# Patient Record
Sex: Female | Born: 1955
Health system: Southern US, Community
[De-identification: ages and names within clinical notes are randomized; demographics above are authoritative.]

## PROBLEM LIST (undated history)

## (undated) DIAGNOSIS — M1990 Unspecified osteoarthritis, unspecified site: Secondary | ICD-10-CM

## (undated) DIAGNOSIS — K219 Gastro-esophageal reflux disease without esophagitis: Secondary | ICD-10-CM

## (undated) DIAGNOSIS — I10 Essential (primary) hypertension: Secondary | ICD-10-CM

## (undated) DIAGNOSIS — R51 Headache: Secondary | ICD-10-CM

## (undated) DIAGNOSIS — T7840XA Allergy, unspecified, initial encounter: Secondary | ICD-10-CM

## (undated) HISTORY — DX: Allergy, unspecified, initial encounter: T78.40XA

---

## 1986-04-29 HISTORY — PX: LASER ABLATION OF THE CERVIX: SHX1949

## 2001-02-11 ENCOUNTER — Encounter: Payer: Self-pay | Admitting: Family Medicine

## 2001-02-11 ENCOUNTER — Ambulatory Visit (HOSPITAL_COMMUNITY): Admission: RE | Admit: 2001-02-11 | Discharge: 2001-02-11 | Payer: Self-pay | Admitting: Family Medicine

## 2001-02-12 ENCOUNTER — Ambulatory Visit (HOSPITAL_COMMUNITY): Admission: RE | Admit: 2001-02-12 | Discharge: 2001-02-12 | Payer: Self-pay | Admitting: Family Medicine

## 2001-02-12 ENCOUNTER — Encounter: Payer: Self-pay | Admitting: Family Medicine

## 2001-09-02 ENCOUNTER — Encounter: Payer: Self-pay | Admitting: Family Medicine

## 2001-09-02 ENCOUNTER — Ambulatory Visit (HOSPITAL_COMMUNITY): Admission: RE | Admit: 2001-09-02 | Discharge: 2001-09-02 | Payer: Self-pay | Admitting: Family Medicine

## 2002-03-10 ENCOUNTER — Ambulatory Visit (HOSPITAL_COMMUNITY): Admission: RE | Admit: 2002-03-10 | Discharge: 2002-03-10 | Payer: Self-pay | Admitting: Family Medicine

## 2002-03-10 ENCOUNTER — Encounter: Payer: Self-pay | Admitting: Family Medicine

## 2002-09-29 ENCOUNTER — Ambulatory Visit (HOSPITAL_COMMUNITY): Admission: RE | Admit: 2002-09-29 | Discharge: 2002-09-29 | Payer: Self-pay | Admitting: Internal Medicine

## 2002-09-29 ENCOUNTER — Encounter: Payer: Self-pay | Admitting: Internal Medicine

## 2004-06-07 ENCOUNTER — Ambulatory Visit (HOSPITAL_COMMUNITY): Admission: RE | Admit: 2004-06-07 | Discharge: 2004-06-07 | Payer: Self-pay | Admitting: Family Medicine

## 2004-06-15 ENCOUNTER — Encounter: Admission: RE | Admit: 2004-06-15 | Discharge: 2004-06-15 | Payer: Self-pay | Admitting: Family Medicine

## 2006-04-01 ENCOUNTER — Encounter: Admission: RE | Admit: 2006-04-01 | Discharge: 2006-04-01 | Payer: Self-pay | Admitting: Family Medicine

## 2006-04-09 ENCOUNTER — Encounter: Admission: RE | Admit: 2006-04-09 | Discharge: 2006-04-09 | Payer: Self-pay | Admitting: Family Medicine

## 2006-10-07 ENCOUNTER — Encounter: Admission: RE | Admit: 2006-10-07 | Discharge: 2006-10-07 | Payer: Self-pay | Admitting: Family Medicine

## 2006-10-27 ENCOUNTER — Ambulatory Visit: Payer: Self-pay | Admitting: Orthopedic Surgery

## 2006-10-28 ENCOUNTER — Encounter (HOSPITAL_COMMUNITY): Admission: RE | Admit: 2006-10-28 | Discharge: 2006-11-27 | Payer: Self-pay | Admitting: Orthopedic Surgery

## 2006-10-28 ENCOUNTER — Ambulatory Visit (HOSPITAL_COMMUNITY): Payer: Self-pay | Admitting: Orthopedic Surgery

## 2006-11-01 ENCOUNTER — Ambulatory Visit (HOSPITAL_COMMUNITY): Payer: Self-pay | Admitting: Internal Medicine

## 2006-11-03 ENCOUNTER — Ambulatory Visit: Payer: Self-pay | Admitting: Orthopedic Surgery

## 2006-11-10 ENCOUNTER — Ambulatory Visit: Payer: Self-pay | Admitting: Orthopedic Surgery

## 2008-02-11 ENCOUNTER — Encounter: Admission: RE | Admit: 2008-02-11 | Discharge: 2008-02-11 | Payer: Self-pay | Admitting: Family Medicine

## 2008-07-15 ENCOUNTER — Encounter: Admission: RE | Admit: 2008-07-15 | Discharge: 2008-07-15 | Payer: Self-pay | Admitting: Family Medicine

## 2009-07-17 ENCOUNTER — Encounter: Admission: RE | Admit: 2009-07-17 | Discharge: 2009-07-17 | Payer: Self-pay | Admitting: Family Medicine

## 2010-04-29 HISTORY — PX: UTERINE FIBROID SURGERY: SHX826

## 2010-05-20 ENCOUNTER — Encounter: Payer: Self-pay | Admitting: Family Medicine

## 2010-08-21 ENCOUNTER — Other Ambulatory Visit: Payer: Self-pay | Admitting: Family Medicine

## 2010-08-21 DIAGNOSIS — Z1231 Encounter for screening mammogram for malignant neoplasm of breast: Secondary | ICD-10-CM

## 2010-08-29 ENCOUNTER — Ambulatory Visit
Admission: RE | Admit: 2010-08-29 | Discharge: 2010-08-29 | Disposition: A | Discharge status: Home / Self Care | Payer: PRIVATE HEALTH INSURANCE | Source: Ambulatory Visit | Attending: Family Medicine | Admitting: Family Medicine

## 2010-08-29 DIAGNOSIS — Z1231 Encounter for screening mammogram for malignant neoplasm of breast: Secondary | ICD-10-CM

## 2010-11-02 ENCOUNTER — Encounter (HOSPITAL_COMMUNITY): Payer: Self-pay

## 2010-11-02 ENCOUNTER — Other Ambulatory Visit (HOSPITAL_COMMUNITY): Payer: PRIVATE HEALTH INSURANCE

## 2010-11-02 ENCOUNTER — Encounter (HOSPITAL_COMMUNITY)
Admission: RE | Admit: 2010-11-02 | Discharge: 2010-11-02 | Disposition: A | Discharge status: Home / Self Care | Payer: No Typology Code available for payment source | Source: Ambulatory Visit | Attending: Obstetrics and Gynecology | Admitting: Obstetrics and Gynecology

## 2010-11-02 HISTORY — DX: Essential (primary) hypertension: I10

## 2010-11-02 HISTORY — DX: Unspecified osteoarthritis, unspecified site: M19.90

## 2010-11-02 HISTORY — DX: Gastro-esophageal reflux disease without esophagitis: K21.9

## 2010-11-02 HISTORY — DX: Headache: R51

## 2010-11-02 LAB — BASIC METABOLIC PANEL
BUN: 14 mg/dL (ref 6–23)
Creatinine, Ser: 0.65 mg/dL (ref 0.50–1.10)
GFR calc non Af Amer: >60 mL/min (ref >60)
Potassium: 3.8 mEq/L (ref 3.5–5.1)
Sodium: 135 mEq/L (ref 135–145)

## 2010-11-02 LAB — CBC
HCT: 33.4 % — ABNORMAL LOW (ref 36.0–46.0)
MCH: 25.5 pg — ABNORMAL LOW (ref 26.0–34.0)
MCHC: 32 g/dL (ref 30.0–36.0)
RBC: 4.19 MIL/uL (ref 3.87–5.11)

## 2010-11-02 MED ORDER — KETOROLAC TROMETHAMINE 15 MG/ML IJ SOLN: 15.0000 mg | Freq: Once | INTRAMUSCULAR | Status: DC

## 2010-11-02 MED ORDER — MEPERIDINE HCL 25 MG/ML IJ SOLN: 6.2500 mg | INTRAMUSCULAR | Status: DC | PRN

## 2010-11-02 MED ORDER — HYDROMORPHONE HCL 1 MG/ML IJ SOLN: 0.2500 mg | INTRAMUSCULAR | Status: DC | PRN

## 2010-11-02 MED ORDER — ACETAMINOPHEN 10 MG/ML IV SOLN: 1000.0000 mg | Freq: Once | INTRAVENOUS | Status: DC | PRN

## 2010-11-02 MED ORDER — FENTANYL CITRATE 0.05 MG/ML IJ SOLN: 1.0000 ug/kg | INTRAMUSCULAR | Status: DC | PRN

## 2010-11-02 MED ORDER — PROMETHAZINE HCL 25 MG/ML IJ SOLN: 6.2500 mg | INTRAMUSCULAR | Status: DC | PRN

## 2010-11-02 NOTE — Patient Instructions (Addendum)
Given written instructions20 Carrie Woodward  11/02/2010   Your procedure is scheduled on:  Tuesday  Report to [facility] at 9 AM.  Call this number if you have problems the morning of surgery: [phone]   Remember:   Do not eat food:After Midnight.  Do not drink clear liquids: After Midnight.  Take these medicines the morning of surgery with A SIP OF WATER: see list    Do not wear jewelry, make-up or nail polish.  Do not bring valuables to the hospital.  Contacts, dentures or bridgework may not be worn into surgery.  Leave suitcase in the car. After surgery it may be brought to your room.  For patients admitted to the hospital, checkout time is 11:00 AM the day of discharge.   Patients discharged the day of surgery will not be allowed to drive home.  Name and phone number of your driver: Jacqlyn Krauss - boyfriend  Special Instructions:    Please read over the following fact sheets that you were given

## 2010-11-02 NOTE — Anesthesia Preprocedure Evaluation (Addendum)
Anesthesia Evaluation  Name, MR# and DOB Patient awake  General Assessment Comment  Reviewed: Allergy & Precautions, H&P  and Patient's Chart, lab work & pertinent test results  Airway Mallampati: III TM Distance: >3 FB Neck ROM: Full    Dental No notable dental hx (+) Teeth Intact   Pulmonaryneg pulmonary ROS    clear to auscultation  pulmonary exam normal   Cardiovascular hypertension, Pt. on medications Regular Normal   Neuro/PsychNegative Neurological ROS Negative Psych ROS  GI/Hepatic/Renal negative GI ROS, negative Renal ROS (+)  GERD Controlled     Endo/Other  Negative Endocrine ROS (+) Diabetes mellitus-, Well Controlled, Type 2, Oral Hypoglycemic Agents  Abdominal Normal abdominal exam  (+)  Abdomen: soft.    Musculoskeletal negative musculoskeletal ROS (+) Fibromyalgia - Hematology negative hematology ROS (+)   Peds  Reproductive/Obstetrics negative OB ROS   Anesthesia Other Findings             Anesthesia Physical Anesthesia Plan  ASA: III  Anesthesia Plan: General   Post-op Pain Management:    Induction: Intravenous  Airway Management Planned: Oral ETT  Additional Equipment:   Intra-op Plan:   Post-operative Plan: Extubation in OR  Informed Consent: I have reviewed the patients History and Physical, chart, labs and discussed the procedure including the risks, benefits and alternatives for the proposed anesthesia with the patient or authorized representative who has indicated his/her understanding and acceptance.   Dental advisory given  Plan Discussed with: CRNA and Anesthesiologist (AP)  Anesthesia Plan Comments:         Anesthesia Quick Evaluation

## 2010-11-12 MED ORDER — OXYMETAZOLINE HCL 0.05 % NA SOLN
2.0000 | Freq: Once | NASAL | Status: DC
  Filled 2010-11-12: qty 15

## 2010-11-13 ENCOUNTER — Encounter (HOSPITAL_COMMUNITY): Payer: Self-pay | Admitting: Certified Registered Nurse Anesthetist

## 2010-11-13 ENCOUNTER — Ambulatory Visit (HOSPITAL_COMMUNITY)
Admission: RE | Admit: 2010-11-13 | Discharge: 2010-11-13 | Disposition: A | Discharge status: Home / Self Care | Payer: No Typology Code available for payment source | Source: Ambulatory Visit | Attending: Obstetrics and Gynecology | Admitting: Obstetrics and Gynecology

## 2010-11-13 ENCOUNTER — Encounter (HOSPITAL_COMMUNITY): Payer: Self-pay | Admitting: Anesthesiology

## 2010-11-13 ENCOUNTER — Ambulatory Visit (HOSPITAL_COMMUNITY): Payer: No Typology Code available for payment source | Admitting: Certified Registered Nurse Anesthetist

## 2010-11-13 ENCOUNTER — Other Ambulatory Visit: Payer: Self-pay | Admitting: Obstetrics and Gynecology

## 2010-11-13 ENCOUNTER — Encounter (HOSPITAL_COMMUNITY)
Admission: RE | Disposition: A | Discharge status: Home / Self Care | Payer: Self-pay | Source: Ambulatory Visit | Attending: Obstetrics and Gynecology

## 2010-11-13 DIAGNOSIS — N920 Excessive and frequent menstruation with regular cycle: Secondary | ICD-10-CM | POA: Insufficient documentation

## 2010-11-13 DIAGNOSIS — Z01818 Encounter for other preprocedural examination: Secondary | ICD-10-CM | POA: Insufficient documentation

## 2010-11-13 DIAGNOSIS — Z01812 Encounter for preprocedural laboratory examination: Secondary | ICD-10-CM | POA: Insufficient documentation

## 2010-11-13 DIAGNOSIS — D250 Submucous leiomyoma of uterus: Secondary | ICD-10-CM | POA: Insufficient documentation

## 2010-11-13 DIAGNOSIS — N840 Polyp of corpus uteri: Secondary | ICD-10-CM | POA: Insufficient documentation

## 2010-11-13 LAB — GLUCOSE, CAPILLARY: Glucose-Capillary: 91 mg/dL (ref 70–99)

## 2010-11-13 SURGERY — DILATATION & CURETTAGE/HYSTEROSCOPY WITH VERSAPOINT RESECTION
Anesthesia: Choice

## 2010-11-13 MED ORDER — KETOROLAC TROMETHAMINE 30 MG/ML IJ SOLN
INTRAMUSCULAR | Status: DC | PRN
  Administered 2010-11-13: 30 mg via INTRAVENOUS

## 2010-11-13 MED ORDER — KETOROLAC TROMETHAMINE 60 MG/2ML IM SOLN
INTRAMUSCULAR | Status: DC | PRN
  Administered 2010-11-13: 30 mg via INTRAMUSCULAR

## 2010-11-13 MED ORDER — ONDANSETRON HCL 4 MG/2ML IJ SOLN
INTRAMUSCULAR | Status: DC | PRN
  Administered 2010-11-13: 4 mg via INTRAVENOUS

## 2010-11-13 MED ORDER — FENTANYL CITRATE 0.05 MG/ML IJ SOLN: 25.0000 ug | INTRAMUSCULAR | Status: DC | PRN

## 2010-11-13 MED ORDER — ONDANSETRON HCL 4 MG/2ML IJ SOLN
INTRAMUSCULAR | Status: AC
  Filled 2010-11-13: qty 2

## 2010-11-13 MED ORDER — CHLOROPROCAINE HCL 1 % IJ SOLN
INTRAMUSCULAR | Status: DC | PRN
  Administered 2010-11-13: 20 mL

## 2010-11-13 MED ORDER — DEXAMETHASONE SODIUM PHOSPHATE 10 MG/ML IJ SOLN
INTRAMUSCULAR | Status: AC
  Filled 2010-11-13: qty 1

## 2010-11-13 MED ORDER — FENTANYL CITRATE 0.05 MG/ML IJ SOLN
INTRAMUSCULAR | Status: AC
  Filled 2010-11-13: qty 2

## 2010-11-13 MED ORDER — LIDOCAINE HCL (CARDIAC) 20 MG/ML IV SOLN
INTRAVENOUS | Status: AC
  Filled 2010-11-13: qty 5

## 2010-11-13 MED ORDER — KETOROLAC TROMETHAMINE 30 MG/ML IJ SOLN
INTRAMUSCULAR | Status: AC
  Filled 2010-11-13: qty 1

## 2010-11-13 MED ORDER — MIDAZOLAM HCL 2 MG/2ML IJ SOLN
INTRAMUSCULAR | Status: AC
  Filled 2010-11-13: qty 2

## 2010-11-13 MED ORDER — PROPOFOL 10 MG/ML IV EMUL
INTRAVENOUS | Status: AC
  Filled 2010-11-13: qty 20

## 2010-11-13 MED ORDER — FENTANYL CITRATE 0.05 MG/ML IJ SOLN
INTRAMUSCULAR | Status: DC | PRN
  Administered 2010-11-13: 100 ug via INTRAVENOUS

## 2010-11-13 MED ORDER — LACTATED RINGERS IV SOLN
INTRAVENOUS | Status: DC
  Administered 2010-11-13 (×2): via INTRAVENOUS

## 2010-11-13 MED ORDER — DEXAMETHASONE SODIUM PHOSPHATE 10 MG/ML IJ SOLN
INTRAMUSCULAR | Status: DC | PRN
  Administered 2010-11-13: 10 mg via INTRAVENOUS

## 2010-11-13 MED ORDER — MEPERIDINE HCL 25 MG/ML IJ SOLN: 6.2500 mg | INTRAMUSCULAR | Status: DC | PRN

## 2010-11-13 MED ORDER — LIDOCAINE HCL (CARDIAC) 20 MG/ML IV SOLN
INTRAVENOUS | Status: DC | PRN
  Administered 2010-11-13: 50 mg via INTRAVENOUS

## 2010-11-13 MED ORDER — PROPOFOL 10 MG/ML IV EMUL
INTRAVENOUS | Status: DC | PRN
  Administered 2010-11-13: 150 mg via INTRAVENOUS

## 2010-11-13 MED ORDER — MIDAZOLAM HCL 5 MG/5ML IJ SOLN
INTRAMUSCULAR | Status: DC | PRN
  Administered 2010-11-13: 2 mg via INTRAVENOUS

## 2010-11-13 MED ORDER — SODIUM CHLORIDE 0.9 % IR SOLN
Status: DC | PRN
  Administered 2010-11-13: 3000 mL

## 2010-11-13 SURGICAL SUPPLY — 19 items
CANISTER SUCTION 2500CC (MISCELLANEOUS) ×2 IMPLANT
CATH ROBINSON RED A/P 16FR (CATHETERS) ×2 IMPLANT
CLOTH BEACON ORANGE TIMEOUT ST (SAFETY) ×2 IMPLANT
CONTAINER PREFILL 10% NBF 60ML (FORM) ×4 IMPLANT
DRAPE UTILITY XL STRL (DRAPES) ×4 IMPLANT
ELECT REM PT RETURN 9FT ADLT (ELECTROSURGICAL) ×2
ELECTRODE REM PT RTRN 9FT ADLT (ELECTROSURGICAL) ×1 IMPLANT
ELECTRODE ROLLER BARREL 22FR (ELECTROSURGICAL) IMPLANT
ELECTRODE ROLLER VERSAPOINT (ELECTRODE) IMPLANT
ELECTRODE RT ANGLE VERSAPOINT (CUTTING LOOP) ×1 IMPLANT
ELECTRODE VAPORCUT 22FR (ELECTROSURGICAL) IMPLANT
GLOVE BIO SURGEON STRL SZ 6.5 (GLOVE) ×2 IMPLANT
GLOVE BIOGEL PI IND STRL 7.0 (GLOVE) ×2 IMPLANT
GLOVE BIOGEL PI INDICATOR 7.0 (GLOVE) ×2
GOWN BRE IMP SLV AUR LG STRL (GOWN DISPOSABLE) ×4 IMPLANT
LOOP ANGLED CUTTING 22FR (CUTTING LOOP) IMPLANT
PACK HYSTEROSCOPY LF (CUSTOM PROCEDURE TRAY) ×2 IMPLANT
TOWEL OR 17X24 6PK STRL BLUE (TOWEL DISPOSABLE) ×4 IMPLANT
WATER STERILE IRR 1000ML POUR (IV SOLUTION) ×2 IMPLANT

## 2010-11-13 NOTE — Transfer of Care (Signed)
Immediate Anesthesia Transfer of Care Note  Patient: Carrie Woodward  Procedure(s) Performed:  DILATATION & CURETTAGE/HYSTEROSCOPY WITH VERSAPOINT RESECTION  Patient Location: PACU  Anesthesia Type: General  Level of Consciousness: awake, alert  and oriented  Airway & Oxygen Therapy: Patient Spontanous Breathing and Patient connected to nasal cannula oxygen  Post-op Assessment: Report given to PACU RN and Post -op Vital signs reviewed and stable  Post vital signs: stable  Complications: No apparent anesthesia complications

## 2010-11-13 NOTE — Anesthesia Postprocedure Evaluation (Signed)
  Anesthesia Post-op Note  Patient: Carrie Woodward  Procedure(s) Performed:  DILATATION & CURETTAGE/HYSTEROSCOPY WITH VERSAPOINT RESECTION  Patient Location: PACU  Anesthesia Type: General  Level of Consciousness: awake, alert  and oriented  Airway and Oxygen Therapy: Patient Spontanous Breathing  Post-op Pain: none  Post-op Assessment: Post-op Vital signs reviewed, Patient's Cardiovascular Status Stable, Respiratory Function Stable, Patent Airway, No signs of Nausea or vomiting and Pain level controlled  Post-op Vital Signs: Reviewed and stable  Complications: No apparent anesthesia complications

## 2010-11-13 NOTE — Preoperative (Addendum)
Beta Blockers   Reason not to administer Beta Blockers:Not ApplicableTaken This AM

## 2010-11-13 NOTE — Brief Op Note (Signed)
11/13/2010  1:13 PM  PATIENT:  Carrie Woodward  55 y.o. female  PRE-OPERATIVE DIAGNOSIS:  menorrhagia;submucosal fibroid;endometrail thickening  POST-OPERATIVE DIAGNOSIS:  same  PROCEDURE:  Procedure(s): DILATATION & CURETTAGE/HYSTEROSCOPY WITH VERSAPOINT RESECTION OF SUBMUCOSAL FIBROID, ENDOMETRIAL POLYP  SURGEON:  Surgeon(s): Zandra Lajeunesse Cathie Beams, MD  PHYSICIAN ASSISTANT: none  ASSISTANTS: NONE  ANESTHESIA:   general and paracervical block  ESTIMATED BLOOD LOSS: MIN  BLOOD ADMINISTERED:none  DRAINS: none   LOCAL MEDICATIONS USED:  NONE  SPECIMEN:  EMC, RESECTION FIBROID, ENDOMETRIAL POLYP  DISPOSITION OF SPECIMEN:  PATHOLOGY  COUNTS:  YES  TOURNIQUET:  * No tourniquets in log *  DICTATION #: [id number]  PLAN OF CARE:HOME  PATIENT DISPOSITION:  PACU - hemodynamically stable.   Delay start of Pharmacological VTE agent (>24hrs) due to surgical blood loss or risk of bleeding:  no              11/13/2010  1:14 PM  PATIENT:  Carrie Woodward  55 y.o. female  PRE-OPERATIVE DIAGNOSIS:  menorrhagia;submucosal fibroid;endometrail thickening  POST-OPERATIVE DIAGNOSIS:  same  PROCEDURE:  Procedure(s): DILATATION & CURETTAGE/HYSTEROSCOPY WITH VERSAPOINT RESECTION  SURGEON:  Surgeon(s): Robertlee Rogacki A Tammra Pressman, MD  ASSISTANTS: none   ANESTHESIA:   general and paracervical block  PROCEDURE:  FINDINGS:  DESCRIPTION OF OPERATION:  ESTIMATED BLOOD LOSS: * No blood loss amount entered *   Intake/Output Summary (Last 24 hours) at 11/13/10 1314 Last data filed at 11/13/10 1244  Gross per 24 hour  Intake   1400 ml  Output    150 ml  Net   1250 ml     BLOOD ADMINISTERED:none   LOCAL MEDICATIONS USED:  NONE  SPECIMEN:  Source of Specimen: UTERUS  DISPOSITION OF SPECIMEN:  PATHOLOGY  COUNTS:  YES  PLAN OF CARE: Transfer to PACU

## 2010-11-14 NOTE — Op Note (Signed)
Carrie Woodward, Carrie Woodward            ACCOUNT NO.:  [id number]  MEDICAL RECORD NO.:  [id number]  LOCATION:  [facility]                          FACILITY:  [facility]  PHYSICIAN:  Maxie Better, M.D.DATE OF BIRTH:  1955/06/05  DATE OF PROCEDURE:  11/13/2010 DATE OF DISCHARGE:  11/13/2010                              OPERATIVE REPORT   PREOPERATIVE DIAGNOSES:  Menorrhagia and submucosal fibroid.  POSTOPERATIVE DIAGNOSES:  Submucosal fibroids x2 and endometrial polyps.  PROCEDURES:  Diagnostic hysteroscopy, hysteroscopic resection of submucosal fibroids, endometrial polyps via VersaPoint, dilation and curettage.  ANESTHESIA:  General, paracervical block.  SURGEON:  Maxie Better, MD  ASSISTANT:  None.  PROCEDURE IN DETAIL:  Under adequate general anesthesia, the patient was placed in the dorsolithotomy position.  She was sterilely prepped and draped in usual fashion.  Indwelling Foley catheter was not placed. Examination under anesthesia revealed a fibroid retroverted uterus.  No adnexal masses could be appreciated.  A bivalve speculum was placed in the vagina.  Single-tooth tenaculum was placed in the anterior lip of cervix.  A 20 mL of 1% lidocaine was injected paracervically.  The cervix was then serially dilated up to #31 [facility] dilator.  A resectoscope with the VersaPoint was inserted and anterior and posterior submucosal fibroid was noted, both of which were resected.  At that point, two polypoid lesions were also noted, both the polyps were also resected.  The left tubal ostia could be seen.  The right was not seen. Severalo pieces of the resected fibroids were removed.  The resectoscope was removed.  The cavity was then curetted.  Resectoscope was inserted. No other lesions were noted.  The procedure was then terminated by removing all instruments from the vagina.  SPECIMENS:  Endometrial curetting, endometrial polyp, and fibroid resected, all sent to pathology.  ESTIMATED  BLOOD LOSS:  Minimal.  FLUID DEFICIT:  90 mL.  COMPLICATIONS:  None.  COUNTS:  Sponge, instrument counts x2 was correct.  FOLLOWUP:  The patient tolerated the procedure well and was transferred to recovery room in stable condition.     Maxie Better, M.D.     [place]/MEDQ  D:  11/13/2010  T:  11/14/2010  Job:  [id number]

## 2011-02-12 LAB — CBC
Hemoglobin: 11.6 — ABNORMAL LOW
RBC: 4.24

## 2011-02-12 LAB — DIFFERENTIAL
Lymphocytes Relative: 26
Lymphs Abs: 1.4
Monocytes Relative: 7
Neutro Abs: 3.5
Neutrophils Relative %: 65

## 2011-03-16 ENCOUNTER — Emergency Department (HOSPITAL_COMMUNITY)
Admission: EM | Admit: 2011-03-16 | Discharge: 2011-03-16 | Disposition: A | Discharge status: Home / Self Care | Payer: No Typology Code available for payment source | Attending: Emergency Medicine | Admitting: Emergency Medicine

## 2011-03-16 ENCOUNTER — Encounter (HOSPITAL_COMMUNITY): Payer: Self-pay | Admitting: Emergency Medicine

## 2011-03-16 DIAGNOSIS — K089 Disorder of teeth and supporting structures, unspecified: Secondary | ICD-10-CM | POA: Insufficient documentation

## 2011-03-16 DIAGNOSIS — K0889 Other specified disorders of teeth and supporting structures: Secondary | ICD-10-CM

## 2011-03-16 MED ORDER — HYDROCODONE-ACETAMINOPHEN 5-500 MG PO TABS: 1.0000 | ORAL_TABLET | Freq: Four times a day (QID) | ORAL | Status: AC | PRN

## 2011-03-16 MED ORDER — PENICILLIN V POTASSIUM 500 MG PO TABS: 500.0000 mg | ORAL_TABLET | Freq: Four times a day (QID) | ORAL | Status: AC

## 2011-03-16 NOTE — ED Provider Notes (Signed)
History     CSN: [id number] Arrival date & time: 03/16/2011 11:46 AM   First MD Initiated Contact with Patient 03/16/11 1203      Chief Complaint  Patient presents with  . Dental Pain    (Consider location/radiation/quality/duration/timing/severity/associated sxs/prior treatment) Patient is a 55 y.o. female presenting with tooth pain. The history is provided by the patient.  Dental Pain  patient reports development of dental pain in the right lower mouth approximately one month ago.  She was initially seen by a dentist and given a prescription for penicillin with improvement in her symptoms she never went back to follow up with his physician.  She now presents with 2 days of worsening pain.  No difficulty swallowing.  No difficulty chewing.  Pain is continued on the right lower side.  His had no lymphadenopathy.  Jugular fever or chills.  Nothing worsens her symptoms.  Nothing improves her symptoms.  Symptoms are moderate to severe.  They're constant  Past Medical History  Diagnosis Date  . Hypertension   . Diabetes mellitus 2011    oral agents  . GERD (gastroesophageal reflux disease)     on meds  . Arthritis     arms, hands  . Headache     Past Surgical History  Procedure Date  . Cesarean section 1982, 1987  . Laser ablation of the cervix 1988    History reviewed. No pertinent family history.  History  Substance Use Topics  . Smoking status: Never Smoker   . Smokeless tobacco: Not on file  . Alcohol Use: No     occassionally    OB History    Grav Para Term Preterm Abortions TAB SAB Ect Mult Living                  Review of Systems  All other systems reviewed and are negative.    Allergies  Review of patient's allergies indicates no known allergies.  Home Medications   Current Outpatient Rx  Name Route Sig Dispense Refill  . ASPIRIN 81 MG PO TABS Oral Take 81 mg by mouth daily with breakfast.      . HYDROCODONE-ACETAMINOPHEN 5-500 MG PO TABS Oral  Take 1-2 tablets by mouth every 4 (four) hours as needed. For pain.     . IBUPROFEN 800 MG PO TABS Oral Take 800 mg by mouth every 8 (eight) hours as needed. For pain.     Marland Kitchen METFORMIN HCL 500 MG PO TABS Oral Take 500 mg by mouth daily with supper. Pt to take with largest meal of day.     Marland Kitchen METOPROLOL SUCCINATE 25 MG PO TB24 Oral Take 25 mg by mouth daily.      Marland Kitchen NAPROXEN SODIUM 220 MG PO TABS Oral Take 220 mg by mouth every 6 (six) hours as needed. For pain.    Marland Kitchen PENICILLIN V POTASSIUM 500 MG PO TABS Oral Take 500 mg by mouth 3 (three) times daily. Pt has been taking penicillin for a month.     Marland Kitchen PRAVASTATIN SODIUM 10 MG PO TABS Oral Take 10 mg by mouth at bedtime.      Marland Kitchen PRESCRIPTION MEDICATION Vaginal Place 1 suppository vaginally 2 (two) times daily. This medication is Boric Acid 600mg  Suppository. Pt to insert  one supp  vaginally  twice a day for 14 days then 1 supp every week there after.    Marland Kitchen HYDROCODONE-ACETAMINOPHEN 5-500 MG PO TABS Oral Take 1-2 tablets by mouth every 6 (six) hours as  needed for pain. 15 tablet 0  . PENICILLIN V POTASSIUM 500 MG PO TABS Oral Take 1 tablet (500 mg total) by mouth 4 (four) times daily. 40 tablet 0    BP 128/109  Pulse 96  Temp(Src) 98.7 F (37.1 C) (Oral)  Resp 22  SpO2 98%  LMP 03/02/2011  Physical Exam  Nursing note and vitals reviewed. Constitutional: She is oriented to person, place, and time. She appears well-developed and well-nourished. No distress.  HENT:  Head: Normocephalic and atraumatic.       Patient with poor dentition and evidence of several extracted teeth.  She has tenderness at her tooth number 27 and 26.  There is evidence of decay in this area.  There is no gingival swelling fluctuance or tenderness.  She's tolerating her secretions.  There is no submandibular swelling or sublingular swelling  Eyes: EOM are normal.  Neck: Normal range of motion.  Pulmonary/Chest: Effort normal.  Abdominal: Soft. She exhibits no distension.  There is no tenderness.  Musculoskeletal: Normal range of motion.  Neurological: She is alert and oriented to person, place, and time.  Skin: Skin is warm and dry.  Psychiatric: She has a normal mood and affect. Judgment normal.    ED Course  Procedures (including critical care time)  Labs Reviewed - No data to display No results found.   1. Pain, dental       MDM  Dental Pain. Home with antibiotics and pain medicine. Recommend dental follow up. No signs of gingival abscess. Tolerating secretions. Airway patent. No sub lingular swelling         Lyanne Co, MD 03/16/11 1220

## 2011-03-16 NOTE — ED Notes (Signed)
Pt reports bottom right toothache ongoing x 1 month. Pt seen by dentist and given antibiotics and pain medication x 1 month. Pt reports medication ineffective. Pain now from neck to top of head.

## 2011-09-02 ENCOUNTER — Other Ambulatory Visit: Payer: Self-pay | Admitting: Family Medicine

## 2011-09-02 DIAGNOSIS — Z1231 Encounter for screening mammogram for malignant neoplasm of breast: Secondary | ICD-10-CM

## 2011-09-09 ENCOUNTER — Ambulatory Visit
Admission: RE | Admit: 2011-09-09 | Discharge: 2011-09-09 | Disposition: A | Discharge status: Home / Self Care | Payer: No Typology Code available for payment source | Source: Ambulatory Visit | Attending: Family Medicine | Admitting: Family Medicine

## 2011-09-09 DIAGNOSIS — Z1231 Encounter for screening mammogram for malignant neoplasm of breast: Secondary | ICD-10-CM

## 2012-10-02 ENCOUNTER — Other Ambulatory Visit: Payer: Self-pay

## 2012-10-02 DIAGNOSIS — Z1231 Encounter for screening mammogram for malignant neoplasm of breast: Secondary | ICD-10-CM

## 2012-11-06 ENCOUNTER — Ambulatory Visit
Admission: RE | Admit: 2012-11-06 | Discharge: 2012-11-06 | Disposition: A | Discharge status: Home / Self Care | Payer: No Typology Code available for payment source | Source: Ambulatory Visit

## 2012-11-06 DIAGNOSIS — Z1231 Encounter for screening mammogram for malignant neoplasm of breast: Secondary | ICD-10-CM

## 2013-05-01 ENCOUNTER — Encounter (HOSPITAL_COMMUNITY): Payer: Self-pay | Admitting: Emergency Medicine

## 2013-05-01 ENCOUNTER — Emergency Department (HOSPITAL_COMMUNITY)
Admission: EM | Admit: 2013-05-01 | Discharge: 2013-05-01 | Disposition: A | Discharge status: Home / Self Care | Payer: No Typology Code available for payment source | Attending: Emergency Medicine | Admitting: Emergency Medicine

## 2013-05-01 DIAGNOSIS — H109 Unspecified conjunctivitis: Secondary | ICD-10-CM | POA: Insufficient documentation

## 2013-05-01 DIAGNOSIS — M129 Arthropathy, unspecified: Secondary | ICD-10-CM | POA: Insufficient documentation

## 2013-05-01 DIAGNOSIS — Z79899 Other long term (current) drug therapy: Secondary | ICD-10-CM | POA: Insufficient documentation

## 2013-05-01 DIAGNOSIS — I10 Essential (primary) hypertension: Secondary | ICD-10-CM | POA: Insufficient documentation

## 2013-05-01 DIAGNOSIS — Z8719 Personal history of other diseases of the digestive system: Secondary | ICD-10-CM | POA: Insufficient documentation

## 2013-05-01 DIAGNOSIS — E119 Type 2 diabetes mellitus without complications: Secondary | ICD-10-CM | POA: Insufficient documentation

## 2013-05-01 DIAGNOSIS — Z7982 Long term (current) use of aspirin: Secondary | ICD-10-CM | POA: Insufficient documentation

## 2013-05-01 MED ORDER — TETRACAINE HCL 0.5 % OP SOLN
1.0000 [drp] | Freq: Once | OPHTHALMIC | Status: AC
  Administered 2013-05-01: 1 [drp] via OPHTHALMIC
  Filled 2013-05-01: qty 2

## 2013-05-01 MED ORDER — POLYMYXIN B-TRIMETHOPRIM 10000-0.1 UNIT/ML-% OP SOLN
1.0000 [drp] | OPHTHALMIC | Status: DC
  Administered 2013-05-01: 1 [drp] via OPHTHALMIC
  Filled 2013-05-01: qty 10

## 2013-05-01 MED ORDER — ACETAMINOPHEN 325 MG PO TABS
650.0000 mg | ORAL_TABLET | Freq: Once | ORAL | Status: AC
  Administered 2013-05-01: 650 mg via ORAL
  Filled 2013-05-01: qty 2

## 2013-05-01 NOTE — ED Notes (Signed)
C/o right eye pain, drainage, eye feel gritty

## 2013-05-01 NOTE — Discharge Instructions (Signed)
Use polytrim drops as directed until symptoms resolve. You may take tylenol or ibuprofen as needed for pain. Refer to attached documents for more information. Return to the ED with worsening or concerning symptoms.

## 2013-05-01 NOTE — ED Provider Notes (Signed)
CSN: [id number]     Arrival date & time 05/01/13  1939 History  This chart was scribed for non-physician practitioner, Phineas Inches, PA-C,working with Delice Bison Ward, DO, by Marlowe Kays, ED Scribe.  This patient was seen in room WTR8/WTR8 and the patient's care was started at 8:39 PM.  Chief Complaint  Patient presents with  . Eye Pain   The history is provided by the patient. No language interpreter was used.   HPI Comments:  Carrie Woodward is a 58 y.o. female with h/o dry eyes who presents to the Emergency Department complaining of worsening right eye burning pain, swelling, and pain that started approximately one week ago. Pt states upon waking this morning, she states she had blurred vision she describes as a "film". She reports associated tearing and headache. She states she was experiencing sensitivity to light, that has now resolved. She states she has been having a "gritty" feeling in her eye, but states that is normal at baseline secondary to the dry eyes. She states she has been around her granddaughter and niece, but denies them having any symptoms. She denies crusting. She denies wearing contact lenses.   Past Medical History  Diagnosis Date  . Hypertension   . Diabetes mellitus 2011    oral agents  . GERD (gastroesophageal reflux disease)     on meds  . Arthritis     arms, hands  . Headache    Past Surgical History  Procedure Laterality Date  . Cesarean section  1982, 1987  . Laser ablation of the cervix  1988   No family history on file. History  Substance Use Topics  . Smoking status: Never Smoker   . Smokeless tobacco: Not on file  . Alcohol Use: No     Comment: occassionally   OB History   Grav Para Term Preterm Abortions TAB SAB Ect Mult Living                 Review of Systems  Constitutional: Negative for fever.  Eyes: Positive for photophobia, pain, discharge, redness and visual disturbance.  All other systems reviewed and are  negative.    Allergies  Review of patient's allergies indicates no known allergies.  Home Medications   Current Outpatient Rx  Name  Route  Sig  Dispense  Refill  . aspirin 81 MG tablet   Oral   Take 81 mg by mouth daily with breakfast.           . HYDROcodone-acetaminophen (VICODIN) 5-500 MG per tablet   Oral   Take 1-2 tablets by mouth every 4 (four) hours as needed. For pain.          Marland Kitchen ibuprofen (ADVIL,MOTRIN) 800 MG tablet   Oral   Take 800 mg by mouth every 8 (eight) hours as needed. For pain.          . metFORMIN (GLUCOPHAGE) 500 MG tablet   Oral   Take 500 mg by mouth daily with supper. Pt to take with largest meal of day.          . metoprolol succinate (TOPROL-XL) 25 MG 24 hr tablet   Oral   Take 25 mg by mouth daily.           . naproxen sodium (ANAPROX) 220 MG tablet   Oral   Take 220 mg by mouth every 6 (six) hours as needed. For pain.         Marland Kitchen penicillin v potassium (VEETID) 500  MG tablet   Oral   Take 500 mg by mouth 3 (three) times daily. Pt has been taking penicillin for a month.          . pravastatin (PRAVACHOL) 10 MG tablet   Oral   Take 10 mg by mouth at bedtime.           Marland Kitchen PRESCRIPTION MEDICATION   Vaginal   Place 1 suppository vaginally 2 (two) times daily. This medication is Boric Acid 600mg  Suppository. Pt to insert  one supp  vaginally  twice a day for 14 days then 1 supp every week there after.          Triage Vitals: BP 152/89  Pulse 91  Temp(Src) 98.4 F (36.9 C) (Oral)  SpO2 97% Physical Exam  Nursing note and vitals reviewed. Constitutional: She is oriented to person, place, and time. She appears well-developed and well-nourished.  HENT:  Head: Normocephalic and atraumatic.  Eyes: EOM are normal. Pupils are equal, round, and reactive to light. Lids are everted and swept, no foreign bodies found. Right eye exhibits discharge (watery discharge). Right eye exhibits no chemosis, no exudate and no hordeolum. No  foreign body present in the right eye. Right conjunctiva is injected. Right conjunctiva has no hemorrhage. Right eye exhibits no nystagmus.  No periorbital swelling. No eyelid tenderness to palpation.  Neck: Normal range of motion.  Cardiovascular: Normal rate.   Pulmonary/Chest: Effort normal.  Musculoskeletal: Normal range of motion.  Neurological: She is alert and oriented to person, place, and time.  Skin: Skin is warm and dry.  Psychiatric: She has a normal mood and affect. Her behavior is normal.    ED Course  Procedures (including critical care time) DIAGNOSTIC STUDIES: Oxygen Saturation is 97% on RA, normal by my interpretation.   COORDINATION OF CARE: 8:44 PM- Will dye eye with fluorescein strip and check for corneal abrasion. Pt verbalizes understanding and agrees to plan.  8:50 PM- No foreign body or abrasion noted. Will treat for conjunctivitis with antibiotic drops. Will give pt Tylenol prior to discharge.    Medications  tetracaine (PONTOCAINE) 0.5 % ophthalmic solution 1 drop (not administered)   Labs Review Labs Reviewed - No data to display Imaging Review No results found.  EKG Interpretation   None       MDM   1. Conjunctivitis     8:58 PM Patient's eye examined with fluorescin dye and wood's lamp. No corneal abrasion noted. Patient likely has conjuctivitis. Patient will be treated with polytrim drops. Patient will have tylenol for pain. Vitals stable and patient afebrile.   I personally performed the services described in this documentation, which was scribed in my presence. The recorded information has been reviewed and is accurate.    Alvina Chou, PA-C 05/01/13 2102

## 2013-05-01 NOTE — ED Provider Notes (Signed)
Medical screening examination/treatment/procedure(s) were performed by non-physician practitioner and as supervising physician I was immediately available for consultation/collaboration.  EKG Interpretation   None         Delice Bison Zaylah Blecha, DO 05/01/13 2350

## 2014-01-31 ENCOUNTER — Other Ambulatory Visit: Payer: Self-pay

## 2014-01-31 DIAGNOSIS — Z1239 Encounter for other screening for malignant neoplasm of breast: Secondary | ICD-10-CM

## 2014-02-09 ENCOUNTER — Encounter (INDEPENDENT_AMBULATORY_CARE_PROVIDER_SITE_OTHER): Payer: Self-pay

## 2014-02-09 ENCOUNTER — Ambulatory Visit
Admission: RE | Admit: 2014-02-09 | Discharge: 2014-02-09 | Disposition: A | Discharge status: Home / Self Care | Payer: No Typology Code available for payment source | Source: Ambulatory Visit

## 2014-02-09 DIAGNOSIS — Z1239 Encounter for other screening for malignant neoplasm of breast: Secondary | ICD-10-CM

## 2014-12-12 ENCOUNTER — Ambulatory Visit (INDEPENDENT_AMBULATORY_CARE_PROVIDER_SITE_OTHER): Payer: PRIVATE HEALTH INSURANCE | Admitting: Family Medicine

## 2014-12-12 VITALS — BP 148/80 | HR 72 | Temp 98.4°F | Resp 16 | Ht 63.0 in | Wt 150.4 lb

## 2014-12-12 DIAGNOSIS — J069 Acute upper respiratory infection, unspecified: Secondary | ICD-10-CM

## 2014-12-12 DIAGNOSIS — R05 Cough: Secondary | ICD-10-CM

## 2014-12-12 DIAGNOSIS — B9789 Other viral agents as the cause of diseases classified elsewhere: Principal | ICD-10-CM

## 2014-12-12 DIAGNOSIS — R058 Other specified cough: Secondary | ICD-10-CM

## 2014-12-12 MED ORDER — HYDROCODONE-HOMATROPINE 5-1.5 MG/5ML PO SYRP: 5.0000 mL | ORAL_SOLUTION | Freq: Three times a day (TID) | ORAL | Status: DC | PRN

## 2014-12-12 MED ORDER — BENZONATATE 100 MG PO CAPS: 100.0000 mg | ORAL_CAPSULE | Freq: Three times a day (TID) | ORAL | Status: DC | PRN

## 2014-12-12 NOTE — Progress Notes (Signed)
Subjective:    Patient ID: Carrie Woodward, female    DOB: 1955-05-15, 59 y.o.   MRN: [id number]  HPI    Review of Systems     Objective:   Physical Exam        Assessment & Plan:   [facility] [address] [place] [address] [phone]  Date:  12/12/2014   Name:  Carrie Woodward   DOB:  02/14/1956   MRN:  [id number]  PCP:  No primary care provider on file.    Chief Complaint: Cough and Mass   History of Present Illness:  Carrie Woodward is a 59 y.o. very pleasant female patient who presents with the following:  Cough that started 2 weeks ago Seems to be worsening/ being persistent Saw her doctor last week who stopped her ace inhibitor and started her on an ARB for BP control Cough is characterized as dry certain smells seem to make the cough worse Denies ST, ear pain, rhinorrhea, nasal congestion although did have some nasal congestion earlier in the course +mild facial pressure  Feels like pills get stuck when she is trying to swallow them No trouble swallowing solids or liquids No fevers No visual change Denies chest pain but does feel like she gets shortness of breath and tightness in throat causing respiratory difficulty intermittently +nausea/post-tussive emesis Not taking any medication for this other than allergy medication and occasional excedrin No bowel or bladder issues other than mild stress incontinence with the coughing Cough is waking her up at night and affecting her sleep Has been out of BP meds x 1 week    There are no active problems to display for this patient.   Past Medical History  Diagnosis Date  . Hypertension   . Diabetes mellitus 2011    oral agents  . GERD (gastroesophageal reflux disease)     on meds  . Arthritis     arms, hands  . Headache(784.0)   . Allergy     Past Surgical History  Procedure Laterality Date  . Cesarean section  1982, 1987  . Laser ablation of the cervix   1988    Social History  Substance Use Topics  . Smoking status: Never Smoker   . Smokeless tobacco: Never Used  . Alcohol Use: No     Comment: occassionally    Family History  Problem Relation Age of Onset  . Diabetes Mother   . Hypertension Mother   . Hypertension Sister   . Hypertension Brother   . Diabetes Maternal Grandmother   . Diabetes Paternal Grandfather     No Known Allergies  Medication list has been reviewed and updated.  Current Outpatient Prescriptions on File Prior to Visit  Medication Sig Dispense Refill  . Amlodipine-Olmesartan (AZOR PO) Take 1 tablet by mouth daily.    Marland Kitchen aspirin 81 MG tablet Take 81 mg by mouth daily with breakfast.      . metFORMIN (GLUCOPHAGE) 500 MG tablet Take 500 mg by mouth daily with supper. Pt to take with largest meal of day.     . Chlorpheniramine-DM (CORICIDIN HBP COUGH/COLD PO) Take 1 tablet by mouth daily as needed (cold/sinus symptoms).    Marland Kitchen ibuprofen (ADVIL,MOTRIN) 200 MG tablet Take 400 mg by mouth every 6 (six) hours as needed (pain).    . pravastatin (PRAVACHOL) 10 MG tablet Take 10 mg by mouth at bedtime.       No current facility-administered medications on  file prior to visit.    Review of Systems:  All other pertinent ROS negative except as seen above  Physical Examination: Filed Vitals:   12/12/14 1449  BP: 148/80  Pulse: 72  Temp: 98.4 F (36.9 C)  Resp: 16   Filed Vitals:   12/12/14 1449  Height: 5\' 3"  (1.6 m)  Weight: 150 lb 6.4 oz (68.221 kg)   Body mass index is 26.65 kg/(m^2). Ideal Body Weight: Weight in (lb) to have BMI = 25: 140.8  GEN: WDWN, NAD, Non-toxic, A & O x 3 HEENT: Atraumatic, Normocephalic. Neck supple. No masses, one left sided cervical lymph node, normal mallampati scoring, no trouble swallowing non erythematous posterior oropharynx, mildly swollen erythematous nasal turbinates. Ears and Nose: No external deformity. CV: RRR, No M/G/R. No JVD. No thrill. No extra heart  sounds. PULM: CTAB, no wheezes, crackles, rhonchi. No retractions. No resp. distress. No accessory muscle use. ABD: S, NT, ND, +BS. No rebound. No HSM. PSYCH: Normally interactive. Conversant. Not depressed or anxious appearing.  Calm demeanor.    Assessment and Plan: Viral upper respiratory tract infection with cough - Plan: benzonatate (TESSALON) 100 MG capsule, HYDROcodone-homatropine (HYCODAN) 5-1.5 MG/5ML syrup  Post-viral cough syndrome - Plan: benzonatate (TESSALON) 100 MG capsule, HYDROcodone-homatropine (HYCODAN) 5-1.5 MG/5ML syrup  Pt appears to be at the end of a virus with a post viral cough Could potentially be secondary to allergies Will give her tessalon to take while working and hycodan to help with sleep Counseled her on supportive care If no improvement in 2 weeks recommended RTC for imaging and consideration of short course of steroids or switching allergy medication Counseled pt to pick up and start taking new BP med   Signed Ben Adams-Doolittle, DO  Patient discussed and plan agreed upon  [employer]. Linna Darner M.D., attending

## 2014-12-12 NOTE — Patient Instructions (Signed)
You are at the end of a virus and having a post viral cough syndrome Will give you medication to take while at work that will not make you sleepy and one to take during sleep that can make you sleepy Otherwise can take OTC medications including saline nasal spray, a decongestant, and tylenol/ibuprofen as needed

## 2015-04-20 ENCOUNTER — Other Ambulatory Visit: Payer: Self-pay

## 2015-04-20 DIAGNOSIS — Z1231 Encounter for screening mammogram for malignant neoplasm of breast: Secondary | ICD-10-CM

## 2015-05-12 ENCOUNTER — Ambulatory Visit: Payer: No Typology Code available for payment source

## 2015-05-31 ENCOUNTER — Ambulatory Visit
Admission: RE | Admit: 2015-05-31 | Discharge: 2015-05-31 | Disposition: A | Discharge status: Home / Self Care | Payer: Managed Care, Other (non HMO) | Source: Ambulatory Visit

## 2015-05-31 DIAGNOSIS — Z1231 Encounter for screening mammogram for malignant neoplasm of breast: Secondary | ICD-10-CM

## 2015-06-02 ENCOUNTER — Other Ambulatory Visit: Payer: Self-pay | Admitting: Family Medicine

## 2015-06-02 DIAGNOSIS — R928 Other abnormal and inconclusive findings on diagnostic imaging of breast: Secondary | ICD-10-CM

## 2015-06-14 ENCOUNTER — Ambulatory Visit
Admission: RE | Admit: 2015-06-14 | Discharge: 2015-06-14 | Disposition: A | Discharge status: Home / Self Care | Payer: Managed Care, Other (non HMO) | Source: Ambulatory Visit | Attending: Family Medicine | Admitting: Family Medicine

## 2015-06-14 ENCOUNTER — Other Ambulatory Visit: Payer: Self-pay | Admitting: Family Medicine

## 2015-06-14 DIAGNOSIS — R928 Other abnormal and inconclusive findings on diagnostic imaging of breast: Secondary | ICD-10-CM

## 2015-06-22 ENCOUNTER — Ambulatory Visit
Admission: RE | Admit: 2015-06-22 | Discharge: 2015-06-22 | Disposition: A | Discharge status: Home / Self Care | Payer: Managed Care, Other (non HMO) | Source: Ambulatory Visit | Attending: Family Medicine | Admitting: Family Medicine

## 2015-06-22 ENCOUNTER — Other Ambulatory Visit: Payer: Self-pay | Admitting: Family Medicine

## 2015-06-22 DIAGNOSIS — R928 Other abnormal and inconclusive findings on diagnostic imaging of breast: Secondary | ICD-10-CM

## 2016-08-13 DIAGNOSIS — I10 Essential (primary) hypertension: Secondary | ICD-10-CM | POA: Diagnosis not present

## 2016-08-27 DIAGNOSIS — E118 Type 2 diabetes mellitus with unspecified complications: Secondary | ICD-10-CM | POA: Diagnosis not present

## 2016-08-27 DIAGNOSIS — I10 Essential (primary) hypertension: Secondary | ICD-10-CM | POA: Diagnosis not present

## 2016-09-09 DIAGNOSIS — Z6828 Body mass index (BMI) 28.0-28.9, adult: Secondary | ICD-10-CM | POA: Diagnosis not present

## 2016-09-09 DIAGNOSIS — E118 Type 2 diabetes mellitus with unspecified complications: Secondary | ICD-10-CM | POA: Diagnosis not present

## 2016-09-09 DIAGNOSIS — I10 Essential (primary) hypertension: Secondary | ICD-10-CM | POA: Diagnosis not present

## 2016-09-17 DIAGNOSIS — M791 Myalgia: Secondary | ICD-10-CM | POA: Diagnosis not present

## 2016-09-17 DIAGNOSIS — I10 Essential (primary) hypertension: Secondary | ICD-10-CM | POA: Diagnosis not present

## 2016-09-18 ENCOUNTER — Ambulatory Visit
Admission: RE | Admit: 2016-09-18 | Discharge: 2016-09-18 | Disposition: A | Discharge status: Home / Self Care | Payer: BLUE CROSS/BLUE SHIELD | Source: Ambulatory Visit | Attending: Family Medicine | Admitting: Family Medicine

## 2016-09-18 ENCOUNTER — Other Ambulatory Visit: Payer: Self-pay | Admitting: Family Medicine

## 2016-09-18 DIAGNOSIS — I10 Essential (primary) hypertension: Secondary | ICD-10-CM | POA: Diagnosis not present

## 2016-09-18 DIAGNOSIS — M25561 Pain in right knee: Secondary | ICD-10-CM

## 2016-09-18 DIAGNOSIS — M542 Cervicalgia: Secondary | ICD-10-CM | POA: Diagnosis not present

## 2016-09-18 DIAGNOSIS — M25461 Effusion, right knee: Secondary | ICD-10-CM | POA: Diagnosis not present

## 2016-09-19 DIAGNOSIS — I10 Essential (primary) hypertension: Secondary | ICD-10-CM | POA: Diagnosis not present

## 2016-10-07 DIAGNOSIS — M25569 Pain in unspecified knee: Secondary | ICD-10-CM | POA: Diagnosis not present

## 2016-10-07 DIAGNOSIS — I10 Essential (primary) hypertension: Secondary | ICD-10-CM | POA: Diagnosis not present

## 2016-10-15 DIAGNOSIS — M25562 Pain in left knee: Secondary | ICD-10-CM | POA: Diagnosis not present

## 2016-10-15 DIAGNOSIS — M25561 Pain in right knee: Secondary | ICD-10-CM | POA: Diagnosis not present

## 2016-10-22 DIAGNOSIS — M25561 Pain in right knee: Secondary | ICD-10-CM | POA: Diagnosis not present

## 2016-11-05 DIAGNOSIS — M25561 Pain in right knee: Secondary | ICD-10-CM | POA: Diagnosis not present

## 2016-11-05 DIAGNOSIS — M25562 Pain in left knee: Secondary | ICD-10-CM | POA: Diagnosis not present

## 2016-11-19 DIAGNOSIS — J069 Acute upper respiratory infection, unspecified: Secondary | ICD-10-CM | POA: Diagnosis not present

## 2016-11-26 DIAGNOSIS — J069 Acute upper respiratory infection, unspecified: Secondary | ICD-10-CM | POA: Diagnosis not present

## 2017-01-21 DIAGNOSIS — E118 Type 2 diabetes mellitus with unspecified complications: Secondary | ICD-10-CM | POA: Diagnosis not present

## 2017-01-21 DIAGNOSIS — I10 Essential (primary) hypertension: Secondary | ICD-10-CM | POA: Diagnosis not present

## 2017-02-25 DIAGNOSIS — Z6828 Body mass index (BMI) 28.0-28.9, adult: Secondary | ICD-10-CM | POA: Diagnosis not present

## 2017-02-25 DIAGNOSIS — I10 Essential (primary) hypertension: Secondary | ICD-10-CM | POA: Diagnosis not present

## 2017-02-25 DIAGNOSIS — E118 Type 2 diabetes mellitus with unspecified complications: Secondary | ICD-10-CM | POA: Diagnosis not present

## 2017-03-08 DIAGNOSIS — M545 Low back pain: Secondary | ICD-10-CM | POA: Diagnosis not present

## 2017-03-10 DIAGNOSIS — M545 Low back pain: Secondary | ICD-10-CM | POA: Diagnosis not present

## 2017-03-10 DIAGNOSIS — I10 Essential (primary) hypertension: Secondary | ICD-10-CM | POA: Diagnosis not present

## 2017-03-18 DIAGNOSIS — M545 Low back pain: Secondary | ICD-10-CM | POA: Diagnosis not present

## 2017-03-18 DIAGNOSIS — E118 Type 2 diabetes mellitus with unspecified complications: Secondary | ICD-10-CM | POA: Diagnosis not present

## 2017-03-18 DIAGNOSIS — I10 Essential (primary) hypertension: Secondary | ICD-10-CM | POA: Diagnosis not present

## 2017-03-21 DIAGNOSIS — M545 Low back pain: Secondary | ICD-10-CM | POA: Diagnosis not present

## 2017-03-21 DIAGNOSIS — M6281 Muscle weakness (generalized): Secondary | ICD-10-CM | POA: Diagnosis not present

## 2017-03-21 DIAGNOSIS — M5440 Lumbago with sciatica, unspecified side: Secondary | ICD-10-CM | POA: Diagnosis not present

## 2017-03-27 DIAGNOSIS — M5440 Lumbago with sciatica, unspecified side: Secondary | ICD-10-CM | POA: Diagnosis not present

## 2017-03-27 DIAGNOSIS — M545 Low back pain: Secondary | ICD-10-CM | POA: Diagnosis not present

## 2017-03-27 DIAGNOSIS — M6281 Muscle weakness (generalized): Secondary | ICD-10-CM | POA: Diagnosis not present

## 2017-03-31 DIAGNOSIS — M6281 Muscle weakness (generalized): Secondary | ICD-10-CM | POA: Diagnosis not present

## 2017-03-31 DIAGNOSIS — M5440 Lumbago with sciatica, unspecified side: Secondary | ICD-10-CM | POA: Diagnosis not present

## 2017-03-31 DIAGNOSIS — M545 Low back pain: Secondary | ICD-10-CM | POA: Diagnosis not present

## 2017-04-01 DIAGNOSIS — E118 Type 2 diabetes mellitus with unspecified complications: Secondary | ICD-10-CM | POA: Diagnosis not present

## 2017-04-01 DIAGNOSIS — M545 Low back pain: Secondary | ICD-10-CM | POA: Diagnosis not present

## 2017-04-01 DIAGNOSIS — R05 Cough: Secondary | ICD-10-CM | POA: Diagnosis not present

## 2017-04-10 DIAGNOSIS — M5440 Lumbago with sciatica, unspecified side: Secondary | ICD-10-CM | POA: Diagnosis not present

## 2017-04-10 DIAGNOSIS — M6281 Muscle weakness (generalized): Secondary | ICD-10-CM | POA: Diagnosis not present

## 2017-04-10 DIAGNOSIS — M545 Low back pain: Secondary | ICD-10-CM | POA: Diagnosis not present

## 2017-04-14 DIAGNOSIS — M545 Low back pain: Secondary | ICD-10-CM | POA: Diagnosis not present

## 2017-04-14 DIAGNOSIS — M5440 Lumbago with sciatica, unspecified side: Secondary | ICD-10-CM | POA: Diagnosis not present

## 2017-04-14 DIAGNOSIS — M6281 Muscle weakness (generalized): Secondary | ICD-10-CM | POA: Diagnosis not present

## 2017-04-15 DIAGNOSIS — M542 Cervicalgia: Secondary | ICD-10-CM | POA: Diagnosis not present

## 2017-04-17 DIAGNOSIS — M5440 Lumbago with sciatica, unspecified side: Secondary | ICD-10-CM | POA: Diagnosis not present

## 2017-04-17 DIAGNOSIS — M545 Low back pain: Secondary | ICD-10-CM | POA: Diagnosis not present

## 2017-04-17 DIAGNOSIS — M6281 Muscle weakness (generalized): Secondary | ICD-10-CM | POA: Diagnosis not present

## 2017-05-01 DIAGNOSIS — M545 Low back pain: Secondary | ICD-10-CM | POA: Diagnosis not present

## 2017-05-01 DIAGNOSIS — M6281 Muscle weakness (generalized): Secondary | ICD-10-CM | POA: Diagnosis not present

## 2017-05-01 DIAGNOSIS — M5440 Lumbago with sciatica, unspecified side: Secondary | ICD-10-CM | POA: Diagnosis not present

## 2017-05-05 DIAGNOSIS — M545 Low back pain: Secondary | ICD-10-CM | POA: Diagnosis not present

## 2017-05-05 DIAGNOSIS — M6281 Muscle weakness (generalized): Secondary | ICD-10-CM | POA: Diagnosis not present

## 2017-05-05 DIAGNOSIS — M5440 Lumbago with sciatica, unspecified side: Secondary | ICD-10-CM | POA: Diagnosis not present

## 2017-05-06 DIAGNOSIS — M545 Low back pain: Secondary | ICD-10-CM | POA: Diagnosis not present

## 2017-05-06 DIAGNOSIS — E119 Type 2 diabetes mellitus without complications: Secondary | ICD-10-CM | POA: Diagnosis not present

## 2017-05-15 DIAGNOSIS — M5440 Lumbago with sciatica, unspecified side: Secondary | ICD-10-CM | POA: Diagnosis not present

## 2017-05-15 DIAGNOSIS — M545 Low back pain: Secondary | ICD-10-CM | POA: Diagnosis not present

## 2017-05-15 DIAGNOSIS — M6281 Muscle weakness (generalized): Secondary | ICD-10-CM | POA: Diagnosis not present

## 2017-05-29 DIAGNOSIS — M6281 Muscle weakness (generalized): Secondary | ICD-10-CM | POA: Diagnosis not present

## 2017-05-29 DIAGNOSIS — M5440 Lumbago with sciatica, unspecified side: Secondary | ICD-10-CM | POA: Diagnosis not present

## 2017-05-29 DIAGNOSIS — M545 Low back pain: Secondary | ICD-10-CM | POA: Diagnosis not present

## 2017-06-03 DIAGNOSIS — E118 Type 2 diabetes mellitus with unspecified complications: Secondary | ICD-10-CM | POA: Diagnosis not present

## 2017-06-03 DIAGNOSIS — M545 Low back pain: Secondary | ICD-10-CM | POA: Diagnosis not present

## 2017-09-02 DIAGNOSIS — I10 Essential (primary) hypertension: Secondary | ICD-10-CM | POA: Diagnosis not present

## 2017-09-02 DIAGNOSIS — E119 Type 2 diabetes mellitus without complications: Secondary | ICD-10-CM | POA: Diagnosis not present

## 2017-09-02 DIAGNOSIS — M791 Myalgia, unspecified site: Secondary | ICD-10-CM | POA: Diagnosis not present

## 2017-09-03 DIAGNOSIS — N950 Postmenopausal bleeding: Secondary | ICD-10-CM | POA: Diagnosis not present

## 2017-09-03 DIAGNOSIS — I10 Essential (primary) hypertension: Secondary | ICD-10-CM | POA: Diagnosis not present

## 2017-09-03 DIAGNOSIS — E1142 Type 2 diabetes mellitus with diabetic polyneuropathy: Secondary | ICD-10-CM | POA: Diagnosis not present

## 2017-09-30 DIAGNOSIS — N950 Postmenopausal bleeding: Secondary | ICD-10-CM | POA: Diagnosis not present

## 2017-09-30 DIAGNOSIS — I10 Essential (primary) hypertension: Secondary | ICD-10-CM | POA: Diagnosis not present

## 2017-09-30 DIAGNOSIS — E119 Type 2 diabetes mellitus without complications: Secondary | ICD-10-CM | POA: Diagnosis not present

## 2017-12-16 DIAGNOSIS — I10 Essential (primary) hypertension: Secondary | ICD-10-CM | POA: Diagnosis not present

## 2017-12-16 DIAGNOSIS — E785 Hyperlipidemia, unspecified: Secondary | ICD-10-CM | POA: Diagnosis not present

## 2017-12-16 DIAGNOSIS — E1165 Type 2 diabetes mellitus with hyperglycemia: Secondary | ICD-10-CM | POA: Diagnosis not present

## 2017-12-16 DIAGNOSIS — E1142 Type 2 diabetes mellitus with diabetic polyneuropathy: Secondary | ICD-10-CM | POA: Diagnosis not present

## 2018-03-17 DIAGNOSIS — E785 Hyperlipidemia, unspecified: Secondary | ICD-10-CM | POA: Diagnosis not present

## 2018-03-17 DIAGNOSIS — B353 Tinea pedis: Secondary | ICD-10-CM | POA: Diagnosis not present

## 2018-03-17 DIAGNOSIS — M79675 Pain in left toe(s): Secondary | ICD-10-CM | POA: Diagnosis not present

## 2018-03-17 DIAGNOSIS — I10 Essential (primary) hypertension: Secondary | ICD-10-CM | POA: Diagnosis not present

## 2018-03-17 DIAGNOSIS — E118 Type 2 diabetes mellitus with unspecified complications: Secondary | ICD-10-CM | POA: Diagnosis not present

## 2018-06-29 DIAGNOSIS — Z6828 Body mass index (BMI) 28.0-28.9, adult: Secondary | ICD-10-CM | POA: Diagnosis not present

## 2018-06-29 DIAGNOSIS — I10 Essential (primary) hypertension: Secondary | ICD-10-CM | POA: Diagnosis not present

## 2018-06-29 DIAGNOSIS — E1165 Type 2 diabetes mellitus with hyperglycemia: Secondary | ICD-10-CM | POA: Diagnosis not present

## 2018-07-23 ENCOUNTER — Ambulatory Visit: Payer: BLUE CROSS/BLUE SHIELD | Admitting: Obstetrics & Gynecology

## 2018-08-06 DIAGNOSIS — N950 Postmenopausal bleeding: Secondary | ICD-10-CM | POA: Diagnosis not present

## 2018-08-06 DIAGNOSIS — Z124 Encounter for screening for malignant neoplasm of cervix: Secondary | ICD-10-CM | POA: Diagnosis not present

## 2018-08-18 DIAGNOSIS — Z538 Procedure and treatment not carried out for other reasons: Secondary | ICD-10-CM | POA: Diagnosis not present

## 2018-08-18 DIAGNOSIS — N950 Postmenopausal bleeding: Secondary | ICD-10-CM | POA: Diagnosis not present

## 2018-09-29 DIAGNOSIS — E1165 Type 2 diabetes mellitus with hyperglycemia: Secondary | ICD-10-CM | POA: Diagnosis not present

## 2018-09-29 DIAGNOSIS — I10 Essential (primary) hypertension: Secondary | ICD-10-CM | POA: Diagnosis not present

## 2018-09-29 DIAGNOSIS — R109 Unspecified abdominal pain: Secondary | ICD-10-CM | POA: Diagnosis not present

## 2018-11-03 DIAGNOSIS — I10 Essential (primary) hypertension: Secondary | ICD-10-CM | POA: Diagnosis not present

## 2018-11-03 DIAGNOSIS — E1165 Type 2 diabetes mellitus with hyperglycemia: Secondary | ICD-10-CM | POA: Diagnosis not present

## 2018-12-01 DIAGNOSIS — I10 Essential (primary) hypertension: Secondary | ICD-10-CM | POA: Diagnosis not present

## 2018-12-01 DIAGNOSIS — R51 Headache: Secondary | ICD-10-CM | POA: Diagnosis not present

## 2018-12-01 DIAGNOSIS — E1165 Type 2 diabetes mellitus with hyperglycemia: Secondary | ICD-10-CM | POA: Diagnosis not present

## 2019-01-05 DIAGNOSIS — E1165 Type 2 diabetes mellitus with hyperglycemia: Secondary | ICD-10-CM | POA: Diagnosis not present

## 2019-01-05 DIAGNOSIS — I10 Essential (primary) hypertension: Secondary | ICD-10-CM | POA: Diagnosis not present

## 2019-02-13 DIAGNOSIS — E1165 Type 2 diabetes mellitus with hyperglycemia: Secondary | ICD-10-CM | POA: Diagnosis not present

## 2019-02-13 DIAGNOSIS — B353 Tinea pedis: Secondary | ICD-10-CM | POA: Diagnosis not present

## 2019-02-13 DIAGNOSIS — I10 Essential (primary) hypertension: Secondary | ICD-10-CM | POA: Diagnosis not present

## 2019-03-15 IMAGING — CR DG KNEE COMPLETE 4+V*R*
4 series · 4 of 4 positions shown · non-contrast
Comparison: None.

CLINICAL DATA: Pain and soft tissue swelling posteriorly at the
right knee.

EXAM:
RIGHT KNEE - COMPLETE 4+ VIEW

[w knee ap right]
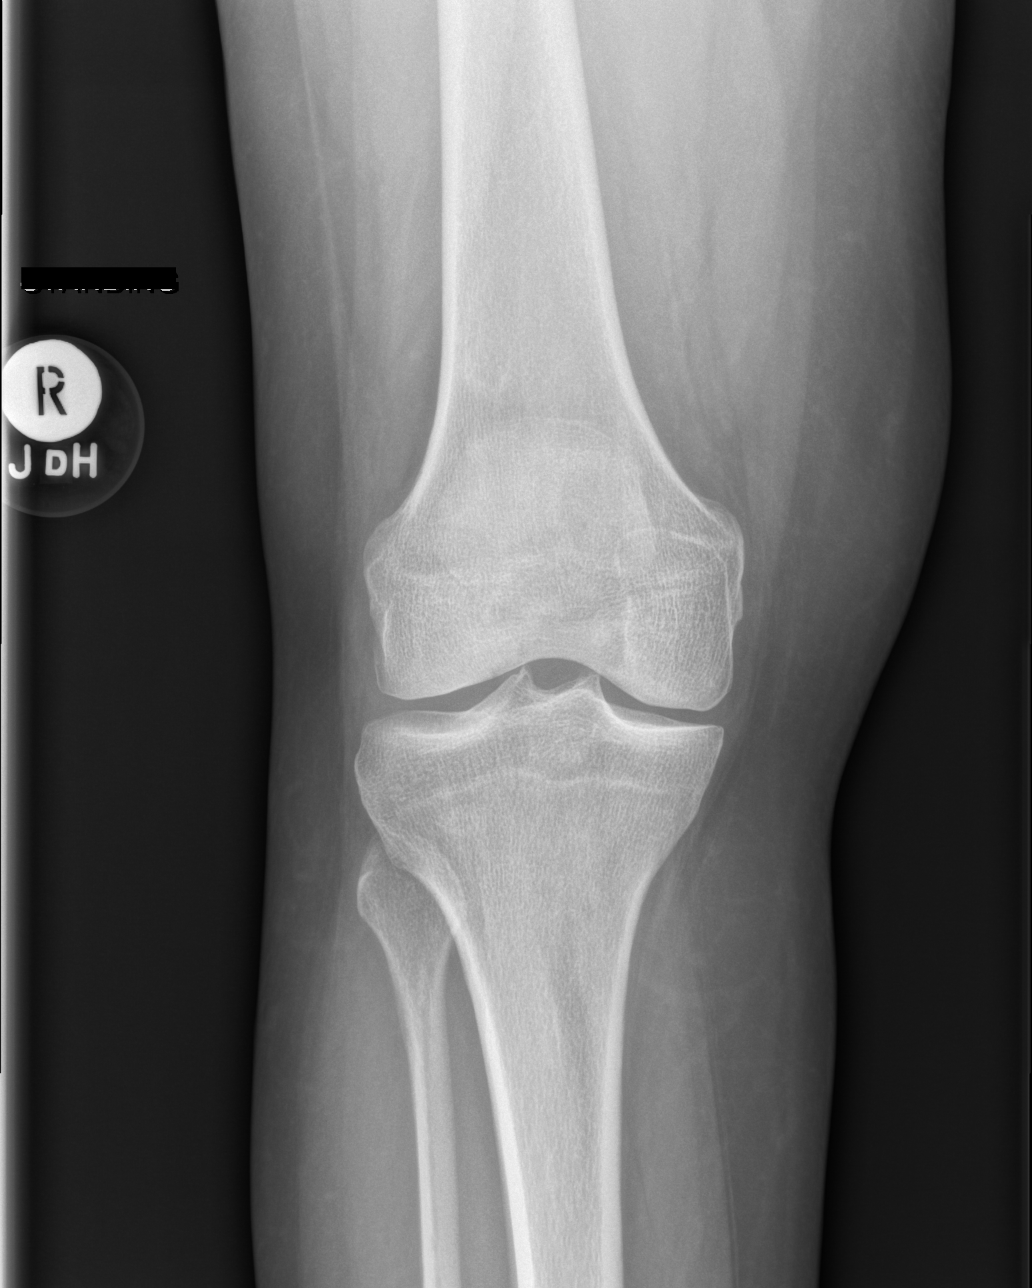

[w knee lat right]
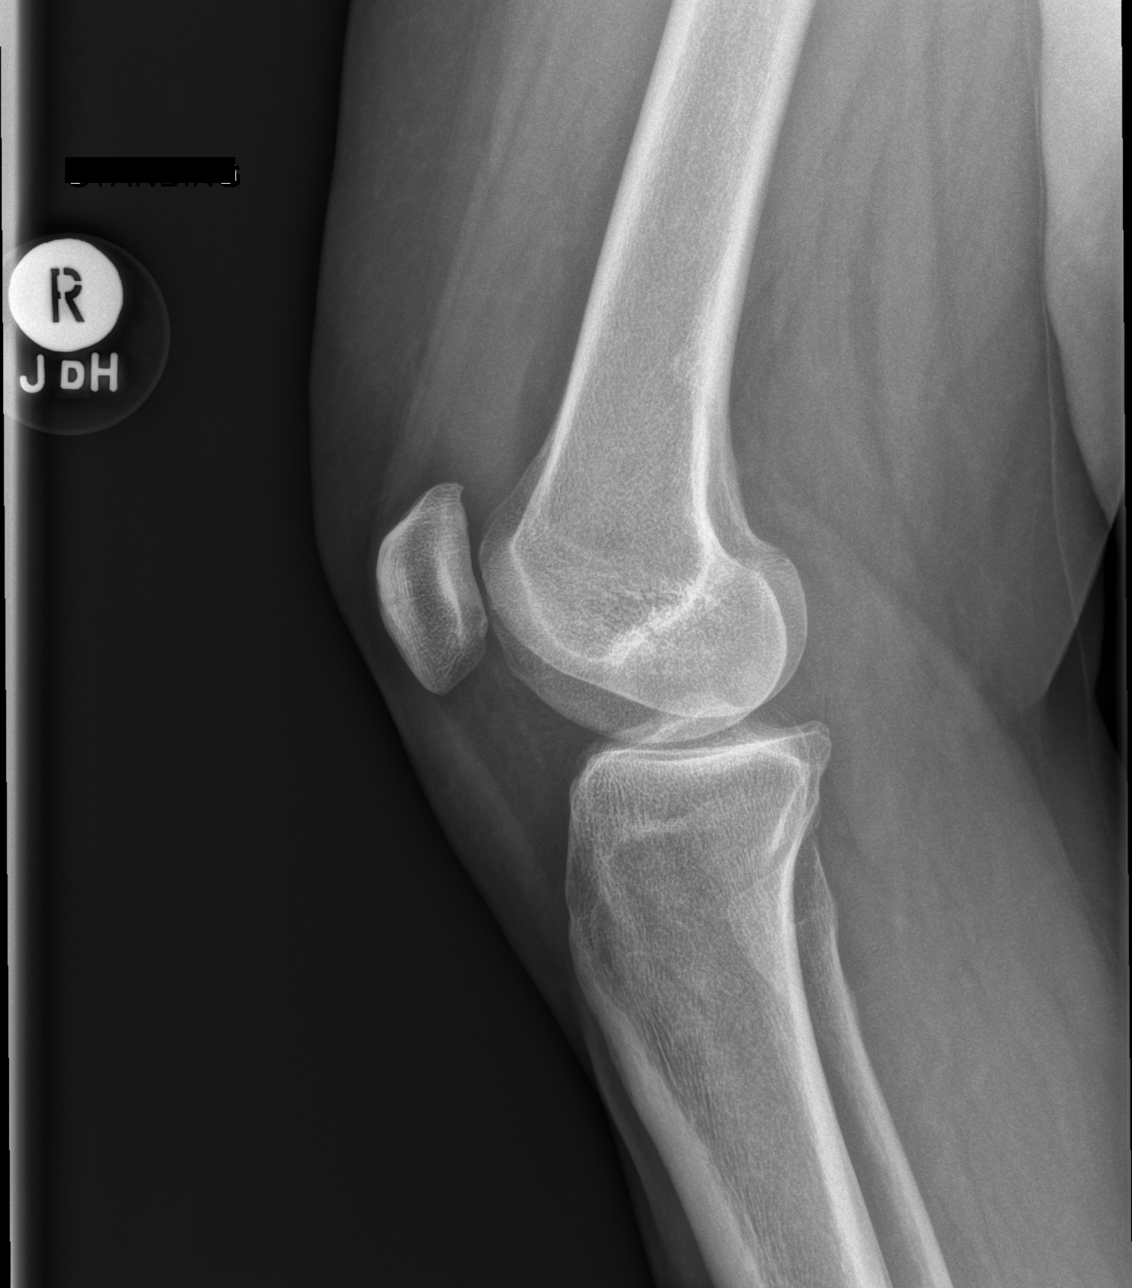

[x knee tunnel right]
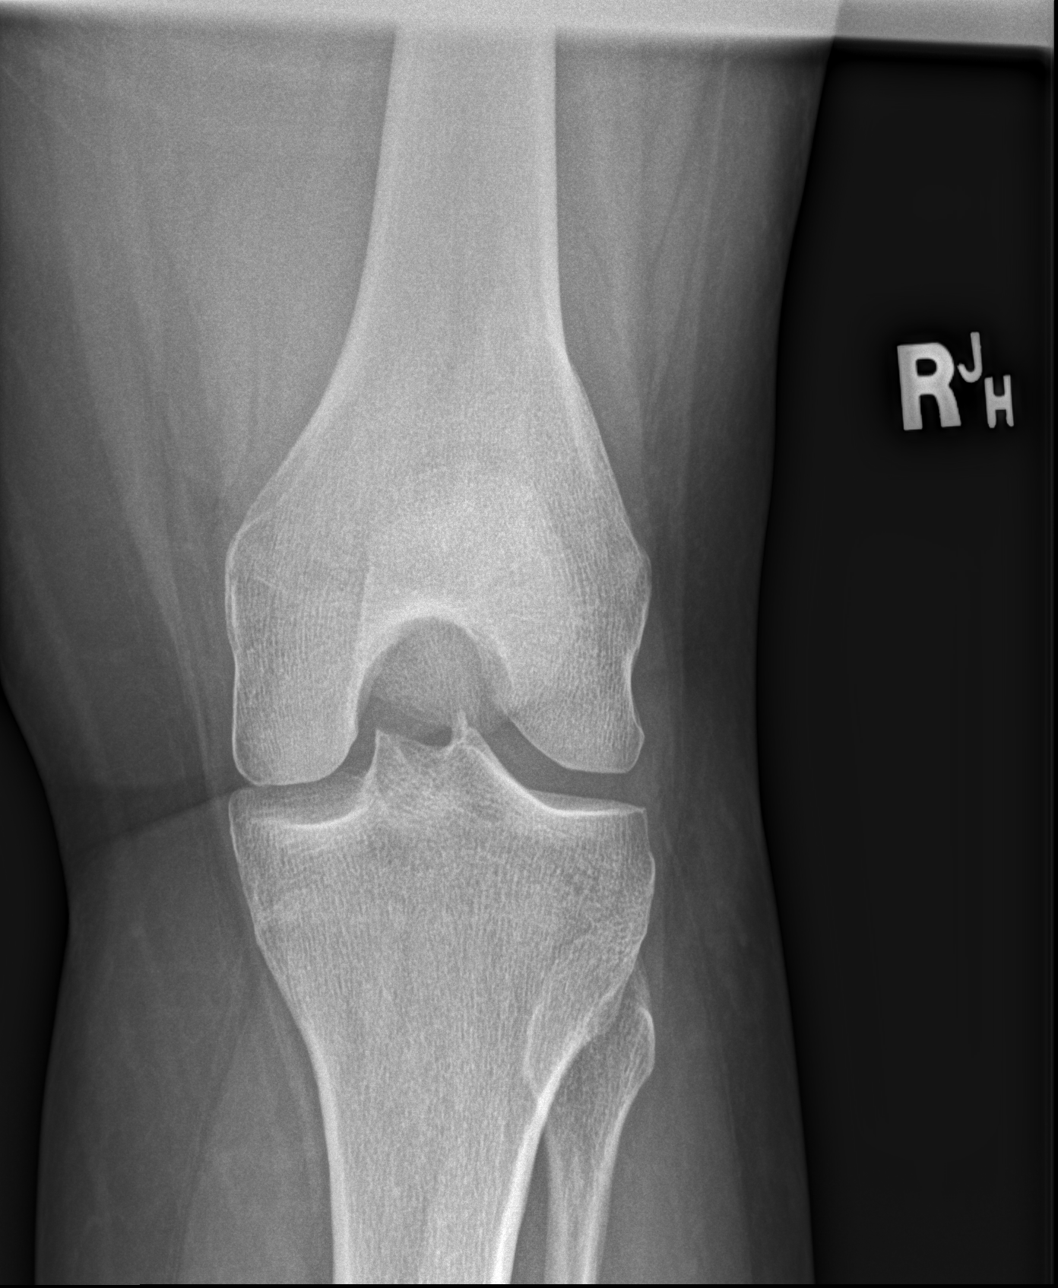

[x knee sunrise right]
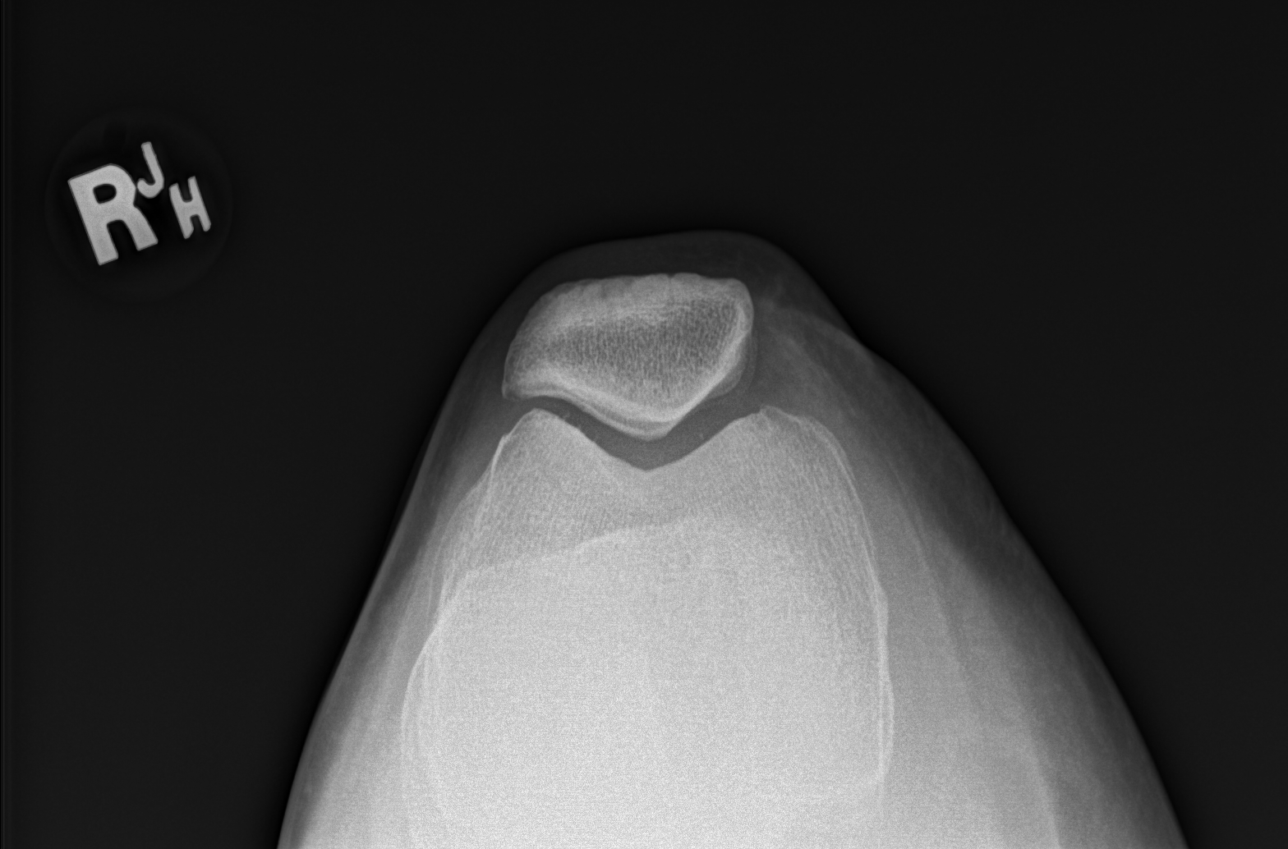

[4 of 4 positions shown; findings below may reference images not displayed]

FINDINGS: There is slight medial joint space narrowing. There is a moderate
joint effusion. Tiny marginal osteophyte on the upper pole of the
patella.
IMPRESSION: Moderate joint effusion.  Slight medial joint space narrowing.

## 2019-03-18 ENCOUNTER — Other Ambulatory Visit: Payer: Self-pay

## 2019-03-18 DIAGNOSIS — Z20822 Contact with and (suspected) exposure to covid-19: Secondary | ICD-10-CM

## 2019-03-20 LAB — NOVEL CORONAVIRUS, NAA: SARS-CoV-2, NAA: NOT DETECTED

## 2019-05-18 DIAGNOSIS — E1122 Type 2 diabetes mellitus with diabetic chronic kidney disease: Secondary | ICD-10-CM | POA: Diagnosis not present

## 2019-05-18 DIAGNOSIS — E785 Hyperlipidemia, unspecified: Secondary | ICD-10-CM | POA: Diagnosis not present

## 2019-05-18 DIAGNOSIS — Z7189 Other specified counseling: Secondary | ICD-10-CM | POA: Diagnosis not present

## 2019-05-18 DIAGNOSIS — I10 Essential (primary) hypertension: Secondary | ICD-10-CM | POA: Diagnosis not present

## 2019-07-09 ENCOUNTER — Other Ambulatory Visit: Payer: Self-pay

## 2019-07-09 ENCOUNTER — Encounter (HOSPITAL_COMMUNITY): Payer: Self-pay | Admitting: Emergency Medicine

## 2019-07-09 ENCOUNTER — Emergency Department (HOSPITAL_COMMUNITY)
Admission: EM | Admit: 2019-07-09 | Discharge: 2019-07-10 | Disposition: A | Discharge status: Home / Self Care | Payer: BC Managed Care – PPO | Attending: Emergency Medicine | Admitting: Emergency Medicine

## 2019-07-09 DIAGNOSIS — I10 Essential (primary) hypertension: Secondary | ICD-10-CM | POA: Insufficient documentation

## 2019-07-09 DIAGNOSIS — E1165 Type 2 diabetes mellitus with hyperglycemia: Secondary | ICD-10-CM | POA: Insufficient documentation

## 2019-07-09 DIAGNOSIS — Z79899 Other long term (current) drug therapy: Secondary | ICD-10-CM | POA: Diagnosis not present

## 2019-07-09 DIAGNOSIS — Z7982 Long term (current) use of aspirin: Secondary | ICD-10-CM | POA: Diagnosis not present

## 2019-07-09 DIAGNOSIS — R631 Polydipsia: Secondary | ICD-10-CM | POA: Diagnosis not present

## 2019-07-09 DIAGNOSIS — R519 Headache, unspecified: Secondary | ICD-10-CM | POA: Insufficient documentation

## 2019-07-09 DIAGNOSIS — R351 Nocturia: Secondary | ICD-10-CM | POA: Diagnosis not present

## 2019-07-09 DIAGNOSIS — Z7984 Long term (current) use of oral hypoglycemic drugs: Secondary | ICD-10-CM | POA: Insufficient documentation

## 2019-07-09 DIAGNOSIS — R358 Other polyuria: Secondary | ICD-10-CM | POA: Diagnosis not present

## 2019-07-09 DIAGNOSIS — R739 Hyperglycemia, unspecified: Secondary | ICD-10-CM | POA: Diagnosis not present

## 2019-07-09 LAB — URINALYSIS, ROUTINE W REFLEX MICROSCOPIC
Bilirubin Urine: NEGATIVE
Glucose, UA: >=500 mg/dL — AB
Hgb urine dipstick: NEGATIVE
Ketones, ur: NEGATIVE mg/dL
Leukocytes,Ua: NEGATIVE
Nitrite: NEGATIVE
Protein, ur: NEGATIVE mg/dL
Specific Gravity, Urine: 1.026 (ref 1.005–1.030)
pH: 5 (ref 5.0–8.0)

## 2019-07-09 LAB — CBC
HCT: 39.5 % (ref 36.0–46.0)
Hemoglobin: 12.8 g/dL (ref 12.0–15.0)
MCH: 27.9 pg (ref 26.0–34.0)
MCHC: 32.4 g/dL (ref 30.0–36.0)
MCV: 86.2 fL (ref 80.0–100.0)
Platelets: 290 10*3/uL (ref 150–400)
RBC: 4.58 MIL/uL (ref 3.87–5.11)
RDW: 13.3 % (ref 11.5–15.5)
WBC: 5.4 10*3/uL (ref 4.0–10.5)
nRBC: 0 % (ref 0.0–0.2)

## 2019-07-09 LAB — CBG MONITORING, ED: Glucose-Capillary: 392 mg/dL — ABNORMAL HIGH (ref 70–99)

## 2019-07-09 MED ORDER — INSULIN ASPART 100 UNIT/ML ~~LOC~~ SOLN
3.0000 [IU] | Freq: Once | SUBCUTANEOUS | Status: AC
  Administered 2019-07-09: 3 [IU] via SUBCUTANEOUS
  Filled 2019-07-09: qty 1

## 2019-07-09 NOTE — ED Provider Notes (Signed)
[facility] EMERGENCY DEPARTMENT Provider Note   CSN: [id number] Arrival date & time: 07/09/19  2243   Time seen 11:15 PM  History Chief Complaint  Patient presents with  . Hyperglycemia    Carrie Woodward is a 64 y.o. female.  HPI Patient states she has been diabetic since 2012.  She states she feels like she has been well controlled until a few months ago.  She states her Metformin had a recall on it several months ago and she stopped taking it.  Her doctor started her on Tradjenta.  She states she takes it once a day with the biggest meal that she eats that day.  She denies any change in her diet although she states she has been trying to cut out her sugars and sweets.  She states she feels like she has been under some increased stress.  She states that her blood sugars in the morning have been her typical 180-190 but the past week they were 200.  She states tonight about 7 PM she ate pizza and a brownie and about 10 PM her mother checked her blood sugar and it was 571.  She then took her Tradjenta.  She states for the past week she has had polyuria and polydipsia and her nocturia has increased from 2 times up to 4-5 times when sleeping.  She denies any weight loss.  She denies nausea, vomiting, or diarrhea.  She states she has a slight headache now and she feels like her vision is little bit blurred.  PCP Iona Beard, MD     Past Medical History:  Diagnosis Date  . Allergy   . Arthritis    arms, hands  . Diabetes mellitus 2011   oral agents  . GERD (gastroesophageal reflux disease)    on meds  . Headache(784.0)   . Hypertension     There are no problems to display for this patient.   Past Surgical History:  Procedure Laterality Date  . [place]  . LASER ABLATION OF THE CERVIX  1988     OB History   No obstetric history on file.     Family History  Problem Relation Age of Onset  . Diabetes Mother   . Hypertension Mother   . Hypertension  Sister   . Hypertension Brother   . Diabetes Maternal Grandmother   . Diabetes Paternal Grandfather     Social History   Tobacco Use  . Smoking status: Never Smoker  . Smokeless tobacco: Never Used  Substance Use Topics  . Alcohol use: No    Alcohol/week: 0.0 standard drinks    Comment: occassionally  . Drug use: No  employed Smokes 12 cigs a day  Home Medications Prior to Admission medications   Medication Sig Start Date End Date Taking? Authorizing Provider  acetaminophen (TYLENOL) 500 MG tablet Take 500 mg by mouth every 8 (eight) hours as needed for mild pain.   Yes [provider]  aspirin 81 MG tablet Take 81 mg by mouth daily with breakfast.     Yes [provider]  benzonatate (TESSALON) 100 MG capsule Take 1-2 capsules (100-200 mg total) by mouth 3 (three) times daily as needed for cough. 12/12/14  Yes Adams-Doolittle, Marland Kitchen, MD  hydrochlorothiazide (HYDRODIURIL) 12.5 MG tablet Take 12.5 mg by mouth daily. 06/15/19  Yes [provider]  linagliptin (TRADJENTA) 5 MG TABS tablet Take 5 mg by mouth daily.   Yes [provider]  telmisartan (MICARDIS)  40 MG tablet Take 40 mg by mouth daily. 06/16/19  Yes [provider]  Amlodipine-Olmesartan (AZOR PO) Take 1 tablet by mouth daily.    [provider]  Chlorpheniramine-DM (CORICIDIN HBP COUGH/COLD PO) Take 1 tablet by mouth daily as needed (cold/sinus symptoms).    [provider]  HYDROcodone-homatropine (HYCODAN) 5-1.5 MG/5ML syrup Take 5 mLs by mouth every 8 (eight) hours as needed for cough. 12/12/14   Adams-Doolittle, Marland Kitchen, MD  ibuprofen (ADVIL,MOTRIN) 200 MG tablet Take 400 mg by mouth every 6 (six) hours as needed (pain).    [provider]  loratadine (CLARITIN) 10 MG tablet Take 10 mg by mouth daily.    [provider]  metFORMIN (GLUCOPHAGE) 500 MG tablet Take 500 mg by mouth daily with supper. Pt to take with largest meal of day.      [provider]  pravastatin (PRAVACHOL) 10 MG tablet Take 10 mg by mouth at bedtime.      [provider]  Patient states she takes aspirin 81 mg, Bystolic, telmisartan, HCTZ, allergy pill not Claritin or Coricidin  Allergies    Patient has no known allergies.  Review of Systems   Review of Systems  Physical Exam Updated Vital Signs BP 139/77 (BP Location: Right Arm)   Pulse 80   Temp 98.4 F (36.9 C) (Oral)   Resp 18   Ht 5\' 4"  (1.626 m)   Wt 69.9 kg   LMP  (LMP Unknown)   SpO2 96%   BMI 26.43 kg/m   Physical Exam  ED Results / Procedures / Treatments   Labs (all labs ordered are listed, but only abnormal results are displayed) Results for orders placed or performed during the hospital encounter of 07/09/19  CBC  Result Value Ref Range   WBC 5.4 4.0 - 10.5 K/uL   RBC 4.58 3.87 - 5.11 MIL/uL   Hemoglobin 12.8 12.0 - 15.0 g/dL   HCT 39.5 36.0 - 46.0 %   MCV 86.2 80.0 - 100.0 fL   MCH 27.9 26.0 - 34.0 pg   MCHC 32.4 30.0 - 36.0 g/dL   RDW 13.3 11.5 - 15.5 %   Platelets 290 150 - 400 K/uL   nRBC 0.0 0.0 - 0.2 %  Basic metabolic panel  Result Value Ref Range   Sodium 132 (L) 135 - 145 mmol/L   Potassium 4.0 3.5 - 5.1 mmol/L   Chloride 96 (L) 98 - 111 mmol/L   CO2 27 22 - 32 mmol/L   Glucose, Bld 406 (H) 70 - 99 mg/dL   BUN 17 8 - 23 mg/dL   Creatinine, Ser 0.68 0.44 - 1.00 mg/dL   Calcium 9.8 8.9 - 10.3 mg/dL   GFR calc non Af Amer >60 >60 mL/min   GFR calc Af Amer >60 >60 mL/min   Anion gap 9 5 - 15  Urinalysis, Routine w reflex microscopic  Result Value Ref Range   Color, Urine STRAW (A) YELLOW   APPearance CLEAR CLEAR   Specific Gravity, Urine 1.026 1.005 - 1.030   pH 5.0 5.0 - 8.0   Glucose, UA >=500 (A) NEGATIVE mg/dL   Hgb urine dipstick NEGATIVE NEGATIVE   Bilirubin Urine NEGATIVE NEGATIVE   Ketones, ur NEGATIVE NEGATIVE mg/dL   Protein, ur NEGATIVE NEGATIVE mg/dL   Nitrite NEGATIVE NEGATIVE   Leukocytes,Ua NEGATIVE NEGATIVE    RBC / HPF 0-5 0 - 5 RBC/hpf   WBC, UA 0-5 0 - 5 WBC/hpf   Bacteria, UA RARE (  A) NONE SEEN   Squamous Epithelial / LPF 11-20 0 - 5  Lactic acid, plasma  Result Value Ref Range   Lactic Acid, Venous 2.0 (HH) 0.5 - 1.9 mmol/L  Lactic acid, plasma  Result Value Ref Range   Lactic Acid, Venous 2.0 (HH) 0.5 - 1.9 mmol/L  CBG monitoring, ED  Result Value Ref Range   Glucose-Capillary 392 (H) 70 - 99 mg/dL   Comment 1 Notify RN    Comment 2 Document in Chart   POC CBG, ED  Result Value Ref Range   Glucose-Capillary 340 (H) 70 - 99 mg/dL  POC CBG, ED  Result Value Ref Range   Glucose-Capillary 261 (H) 70 - 99 mg/dL   Laboratory interpretation all normal except glucosuria, hyperglycemia without metabolic acidosis with normal anion gap minimally elevated lactic acid    EKG None  Radiology No results found.  Procedures Procedures (including critical care time)  Medications Ordered in ED Medications  insulin aspart (novoLOG) injection 3 Units (3 Units Subcutaneous Given 07/09/19 2349)  insulin aspart (novoLOG) injection 5 Units (5 Units Subcutaneous Given 07/10/19 0209)    ED Course  I have reviewed the triage vital signs and the nursing notes.  Pertinent labs & imaging results that were available during my care of the patient were reviewed by me and considered in my medical decision making (see chart for details).    MDM Rules/Calculators/A&P                      Patient was offered IV fluids however she states "I am afraid of needles".  She preferred to drink oral fluids.  Been going to give a low dose of insulin while we are waiting on her blood work to return.  Recheck at 1:20 AM patient's repeat CBG was 340.  I talked to the patient and we will give her another low dose of insulin subcu and have her continue to drink fluids.  She did not want to do IVs.  3:10 AM patient's last CBG has improved to 261.  I gave patient dietary information to refresh her memory about how to  manage her diabetes.  She should keep a log to show to her primary care doctor to help him decide if he needs to change her medication or adjust her dose.  Final Clinical Impression(s) / ED Diagnoses Final diagnoses:  Hyperglycemia    Rx / DC Orders ED Discharge Orders    None     Plan discharge  Rolland Porter, MD, Barbette Or, MD 07/10/19 [phone]

## 2019-07-09 NOTE — ED Triage Notes (Signed)
Patient states that her blood glucose is 571. Patient states that hyperglycemia started today.

## 2019-07-10 LAB — BASIC METABOLIC PANEL
Anion gap: 9 (ref 5–15)
BUN: 17 mg/dL (ref 8–23)
CO2: 27 mmol/L (ref 22–32)
Calcium: 9.8 mg/dL (ref 8.9–10.3)
Chloride: 96 mmol/L — ABNORMAL LOW (ref 98–111)
Creatinine, Ser: 0.68 mg/dL (ref 0.44–1.00)
GFR calc Af Amer: >60 mL/min (ref >60)
GFR calc non Af Amer: >60 mL/min (ref >60)
Glucose, Bld: 406 mg/dL — ABNORMAL HIGH (ref 70–99)
Potassium: 4 mmol/L (ref 3.5–5.1)
Sodium: 132 mmol/L — ABNORMAL LOW (ref 135–145)

## 2019-07-10 LAB — CBG MONITORING, ED
Glucose-Capillary: 261 mg/dL — ABNORMAL HIGH (ref 70–99)
Glucose-Capillary: 340 mg/dL — ABNORMAL HIGH (ref 70–99)

## 2019-07-10 LAB — LACTIC ACID, PLASMA
Lactic Acid, Venous: 2 mmol/L (ref 0.5–1.9)
Lactic Acid, Venous: 2 mmol/L (ref 0.5–1.9)

## 2019-07-10 MED ORDER — INSULIN ASPART 100 UNIT/ML ~~LOC~~ SOLN
5.0000 [IU] | Freq: Once | SUBCUTANEOUS | Status: AC
  Administered 2019-07-10: 02:00:00 5 [IU] via SUBCUTANEOUS
  Filled 2019-07-10: qty 1

## 2019-07-10 NOTE — ED Notes (Signed)
ED Provider at bedside. 

## 2019-07-10 NOTE — ED Notes (Signed)
Date and time results received: 07/10/19 0234   Test: Lactic Critical Value: 2.0  Name of Provider Notified: Rolland Porter MD

## 2019-07-10 NOTE — ED Notes (Signed)
Date and time results received: 07/10/19 0006   Test: Lactic Critical Value: 2.0  Name of Provider Notified: Rolland Porter MD

## 2019-07-10 NOTE — Discharge Instructions (Addendum)
Look at the diabetic diet information sheet to refresh what you should be doing for your diet.  Please write down your fasting blood sugars in the morning and your evening blood sugars and give them to Dr. Berdine Addison to review so he can decide if you need your medication to be changed.

## 2019-07-12 DIAGNOSIS — E1169 Type 2 diabetes mellitus with other specified complication: Secondary | ICD-10-CM | POA: Diagnosis not present

## 2019-07-19 DIAGNOSIS — E1169 Type 2 diabetes mellitus with other specified complication: Secondary | ICD-10-CM | POA: Diagnosis not present

## 2019-08-16 DIAGNOSIS — I10 Essential (primary) hypertension: Secondary | ICD-10-CM | POA: Diagnosis not present

## 2019-08-16 DIAGNOSIS — E1169 Type 2 diabetes mellitus with other specified complication: Secondary | ICD-10-CM | POA: Diagnosis not present

## 2019-08-16 DIAGNOSIS — M545 Low back pain: Secondary | ICD-10-CM | POA: Diagnosis not present

## 2019-10-16 DIAGNOSIS — I10 Essential (primary) hypertension: Secondary | ICD-10-CM | POA: Diagnosis not present

## 2019-10-16 DIAGNOSIS — E1165 Type 2 diabetes mellitus with hyperglycemia: Secondary | ICD-10-CM | POA: Diagnosis not present

## 2019-10-16 DIAGNOSIS — E785 Hyperlipidemia, unspecified: Secondary | ICD-10-CM | POA: Diagnosis not present

## 2019-10-16 DIAGNOSIS — E1169 Type 2 diabetes mellitus with other specified complication: Secondary | ICD-10-CM | POA: Diagnosis not present

## 2019-10-16 DIAGNOSIS — E1122 Type 2 diabetes mellitus with diabetic chronic kidney disease: Secondary | ICD-10-CM | POA: Diagnosis not present

## 2019-11-27 DIAGNOSIS — I10 Essential (primary) hypertension: Secondary | ICD-10-CM | POA: Diagnosis not present

## 2019-11-27 DIAGNOSIS — E1169 Type 2 diabetes mellitus with other specified complication: Secondary | ICD-10-CM | POA: Diagnosis not present

## 2019-12-28 DIAGNOSIS — I10 Essential (primary) hypertension: Secondary | ICD-10-CM | POA: Diagnosis not present

## 2019-12-28 DIAGNOSIS — E1169 Type 2 diabetes mellitus with other specified complication: Secondary | ICD-10-CM | POA: Diagnosis not present

## 2019-12-28 DIAGNOSIS — R10816 Epigastric abdominal tenderness: Secondary | ICD-10-CM | POA: Diagnosis not present

## 2020-03-13 ENCOUNTER — Other Ambulatory Visit: Payer: Self-pay | Admitting: Family Medicine

## 2020-03-13 DIAGNOSIS — Z1231 Encounter for screening mammogram for malignant neoplasm of breast: Secondary | ICD-10-CM

## 2020-03-15 ENCOUNTER — Ambulatory Visit: Payer: BC Managed Care – PPO

## 2020-03-30 ENCOUNTER — Inpatient Hospital Stay: Admission: RE | Admit: 2020-03-30 | Payer: Self-pay | Source: Ambulatory Visit

## 2020-04-05 ENCOUNTER — Other Ambulatory Visit: Payer: Self-pay

## 2020-04-05 ENCOUNTER — Ambulatory Visit
Admission: RE | Admit: 2020-04-05 | Discharge: 2020-04-05 | Disposition: A | Discharge status: Home / Self Care | Payer: Self-pay | Source: Ambulatory Visit | Attending: Family Medicine | Admitting: Family Medicine

## 2020-04-05 DIAGNOSIS — Z1231 Encounter for screening mammogram for malignant neoplasm of breast: Secondary | ICD-10-CM

## 2020-04-11 ENCOUNTER — Other Ambulatory Visit: Payer: Self-pay | Admitting: Family Medicine

## 2020-04-11 DIAGNOSIS — R928 Other abnormal and inconclusive findings on diagnostic imaging of breast: Secondary | ICD-10-CM

## 2020-04-20 ENCOUNTER — Ambulatory Visit: Payer: 59

## 2020-04-20 ENCOUNTER — Ambulatory Visit
Admission: RE | Admit: 2020-04-20 | Discharge: 2020-04-20 | Disposition: A | Discharge status: Home / Self Care | Payer: 59 | Source: Ambulatory Visit | Attending: Family Medicine | Admitting: Family Medicine

## 2020-04-20 ENCOUNTER — Other Ambulatory Visit: Payer: Self-pay | Admitting: Family Medicine

## 2020-04-20 ENCOUNTER — Other Ambulatory Visit: Payer: Self-pay

## 2020-04-20 DIAGNOSIS — R928 Other abnormal and inconclusive findings on diagnostic imaging of breast: Secondary | ICD-10-CM

## 2020-04-20 DIAGNOSIS — R921 Mammographic calcification found on diagnostic imaging of breast: Secondary | ICD-10-CM

## 2020-07-13 ENCOUNTER — Ambulatory Visit
Admission: RE | Admit: 2020-07-13 | Discharge: 2020-07-13 | Disposition: A | Discharge status: Home / Self Care | Payer: 59 | Source: Ambulatory Visit | Attending: Family Medicine | Admitting: Family Medicine

## 2020-07-13 ENCOUNTER — Other Ambulatory Visit: Payer: Self-pay | Admitting: Family Medicine

## 2020-07-13 ENCOUNTER — Other Ambulatory Visit: Payer: Self-pay

## 2020-07-13 DIAGNOSIS — R921 Mammographic calcification found on diagnostic imaging of breast: Secondary | ICD-10-CM

## 2020-07-14 ENCOUNTER — Other Ambulatory Visit: Payer: Self-pay | Admitting: Family Medicine

## 2020-07-14 DIAGNOSIS — R921 Mammographic calcification found on diagnostic imaging of breast: Secondary | ICD-10-CM

## 2020-08-02 ENCOUNTER — Ambulatory Visit
Admission: RE | Admit: 2020-08-02 | Discharge: 2020-08-02 | Disposition: A | Discharge status: Home / Self Care | Payer: 59 | Source: Ambulatory Visit | Attending: Family Medicine | Admitting: Family Medicine

## 2020-08-02 ENCOUNTER — Other Ambulatory Visit: Payer: Self-pay

## 2020-08-02 DIAGNOSIS — R921 Mammographic calcification found on diagnostic imaging of breast: Secondary | ICD-10-CM

## 2020-08-09 ENCOUNTER — Other Ambulatory Visit: Payer: Self-pay | Admitting: General Surgery

## 2020-08-09 DIAGNOSIS — R928 Other abnormal and inconclusive findings on diagnostic imaging of breast: Secondary | ICD-10-CM

## 2020-08-15 ENCOUNTER — Other Ambulatory Visit: Payer: Self-pay | Admitting: General Surgery

## 2020-09-11 ENCOUNTER — Other Ambulatory Visit: Payer: Self-pay | Admitting: General Surgery

## 2020-09-11 DIAGNOSIS — R928 Other abnormal and inconclusive findings on diagnostic imaging of breast: Secondary | ICD-10-CM

## 2020-09-26 ENCOUNTER — Other Ambulatory Visit: Payer: Self-pay

## 2020-09-26 ENCOUNTER — Encounter (HOSPITAL_BASED_OUTPATIENT_CLINIC_OR_DEPARTMENT_OTHER): Payer: Self-pay | Admitting: General Surgery

## 2020-10-02 ENCOUNTER — Encounter (HOSPITAL_BASED_OUTPATIENT_CLINIC_OR_DEPARTMENT_OTHER)
Admission: RE | Admit: 2020-10-02 | Discharge: 2020-10-02 | Disposition: A | Discharge status: Home / Self Care | Payer: 59 | Source: Ambulatory Visit | Attending: General Surgery | Admitting: General Surgery

## 2020-10-02 DIAGNOSIS — Z01818 Encounter for other preprocedural examination: Secondary | ICD-10-CM | POA: Insufficient documentation

## 2020-10-02 LAB — BASIC METABOLIC PANEL
Anion gap: 11 (ref 5–15)
BUN: 20 mg/dL (ref 8–23)
CO2: 27 mmol/L (ref 22–32)
Calcium: 9.7 mg/dL (ref 8.9–10.3)
Chloride: 97 mmol/L — ABNORMAL LOW (ref 98–111)
Creatinine, Ser: 0.83 mg/dL (ref 0.44–1.00)
GFR, Estimated: >60 mL/min (ref >60)
Glucose, Bld: 211 mg/dL — ABNORMAL HIGH (ref 70–99)
Potassium: 3.8 mmol/L (ref 3.5–5.1)
Sodium: 135 mmol/L (ref 135–145)

## 2020-10-02 MED ORDER — ENSURE PRE-SURGERY PO LIQD: 296.0000 mL | Freq: Once | ORAL | Status: DC

## 2020-10-02 NOTE — Progress Notes (Signed)

## 2020-10-03 ENCOUNTER — Ambulatory Visit
Admission: RE | Admit: 2020-10-03 | Discharge: 2020-10-03 | Disposition: A | Discharge status: Home / Self Care | Payer: 59 | Source: Ambulatory Visit | Attending: General Surgery | Admitting: General Surgery

## 2020-10-03 ENCOUNTER — Other Ambulatory Visit: Payer: Self-pay

## 2020-10-03 DIAGNOSIS — R928 Other abnormal and inconclusive findings on diagnostic imaging of breast: Secondary | ICD-10-CM

## 2020-10-04 ENCOUNTER — Encounter (HOSPITAL_BASED_OUTPATIENT_CLINIC_OR_DEPARTMENT_OTHER)
Admission: RE | Disposition: A | Discharge status: Home / Self Care | Payer: Self-pay | Source: Home / Self Care | Attending: General Surgery

## 2020-10-04 ENCOUNTER — Ambulatory Visit (HOSPITAL_BASED_OUTPATIENT_CLINIC_OR_DEPARTMENT_OTHER): Payer: 59 | Admitting: Anesthesiology

## 2020-10-04 ENCOUNTER — Ambulatory Visit
Admission: RE | Admit: 2020-10-04 | Discharge: 2020-10-04 | Disposition: A | Discharge status: Home / Self Care | Payer: 59 | Source: Ambulatory Visit | Attending: General Surgery | Admitting: General Surgery

## 2020-10-04 ENCOUNTER — Ambulatory Visit (HOSPITAL_BASED_OUTPATIENT_CLINIC_OR_DEPARTMENT_OTHER)
Admission: RE | Admit: 2020-10-04 | Discharge: 2020-10-04 | Disposition: A | Discharge status: Home / Self Care | Payer: 59 | Attending: General Surgery | Admitting: General Surgery

## 2020-10-04 ENCOUNTER — Encounter (HOSPITAL_BASED_OUTPATIENT_CLINIC_OR_DEPARTMENT_OTHER): Payer: Self-pay | Admitting: General Surgery

## 2020-10-04 ENCOUNTER — Other Ambulatory Visit: Payer: Self-pay

## 2020-10-04 DIAGNOSIS — K219 Gastro-esophageal reflux disease without esophagitis: Secondary | ICD-10-CM | POA: Diagnosis not present

## 2020-10-04 DIAGNOSIS — Z791 Long term (current) use of non-steroidal anti-inflammatories (NSAID): Secondary | ICD-10-CM | POA: Diagnosis not present

## 2020-10-04 DIAGNOSIS — Z79899 Other long term (current) drug therapy: Secondary | ICD-10-CM | POA: Diagnosis not present

## 2020-10-04 DIAGNOSIS — E119 Type 2 diabetes mellitus without complications: Secondary | ICD-10-CM | POA: Diagnosis not present

## 2020-10-04 DIAGNOSIS — D241 Benign neoplasm of right breast: Secondary | ICD-10-CM | POA: Insufficient documentation

## 2020-10-04 DIAGNOSIS — R928 Other abnormal and inconclusive findings on diagnostic imaging of breast: Secondary | ICD-10-CM

## 2020-10-04 DIAGNOSIS — N6011 Diffuse cystic mastopathy of right breast: Secondary | ICD-10-CM | POA: Diagnosis present

## 2020-10-04 HISTORY — PX: RADIOACTIVE SEED GUIDED EXCISIONAL BREAST BIOPSY: SHX6490

## 2020-10-04 LAB — GLUCOSE, CAPILLARY
Glucose-Capillary: 160 mg/dL — ABNORMAL HIGH (ref 70–99)
Glucose-Capillary: 144 mg/dL — ABNORMAL HIGH (ref 70–99)

## 2020-10-04 SURGERY — RADIOACTIVE SEED GUIDED BREAST BIOPSY
Anesthesia: General | Site: Breast | Laterality: Right

## 2020-10-04 MED ORDER — FENTANYL CITRATE (PF) 100 MCG/2ML IJ SOLN
INTRAMUSCULAR | Status: DC | PRN
  Administered 2020-10-04: 50 ug via INTRAVENOUS
  Administered 2020-10-04: 25 ug via INTRAVENOUS

## 2020-10-04 MED ORDER — ONDANSETRON HCL 4 MG/2ML IJ SOLN
4.0000 mg | Freq: Once | INTRAMUSCULAR | Status: DC | PRN
Start: 2020-10-04 — End: 2020-10-04

## 2020-10-04 MED ORDER — CEFAZOLIN SODIUM-DEXTROSE 2-4 GM/100ML-% IV SOLN
INTRAVENOUS | Status: AC
  Filled 2020-10-04: qty 100

## 2020-10-04 MED ORDER — OXYCODONE HCL 5 MG PO TABS
ORAL_TABLET | ORAL | Status: AC
  Filled 2020-10-04: qty 1

## 2020-10-04 MED ORDER — ACETAMINOPHEN 500 MG PO TABS
ORAL_TABLET | ORAL | Status: AC
  Filled 2020-10-04: qty 2

## 2020-10-04 MED ORDER — EPHEDRINE 5 MG/ML INJ
INTRAVENOUS | Status: AC
  Filled 2020-10-04: qty 20

## 2020-10-04 MED ORDER — PROPOFOL 500 MG/50ML IV EMUL
INTRAVENOUS | Status: DC | PRN
  Administered 2020-10-04: 25 ug/kg/min via INTRAVENOUS

## 2020-10-04 MED ORDER — PROPOFOL 10 MG/ML IV BOLUS
INTRAVENOUS | Status: DC | PRN
  Administered 2020-10-04: 150 mg via INTRAVENOUS

## 2020-10-04 MED ORDER — BUPIVACAINE HCL (PF) 0.25 % IJ SOLN
INTRAMUSCULAR | Status: DC | PRN
  Administered 2020-10-04: 10 mL

## 2020-10-04 MED ORDER — ACETAMINOPHEN 500 MG PO TABS
1000.0000 mg | ORAL_TABLET | ORAL | Status: AC
  Administered 2020-10-04: 1000 mg via ORAL

## 2020-10-04 MED ORDER — EPHEDRINE 5 MG/ML INJ
INTRAVENOUS | Status: AC
  Filled 2020-10-04: qty 10

## 2020-10-04 MED ORDER — ONDANSETRON HCL 4 MG/2ML IJ SOLN
INTRAMUSCULAR | Status: DC | PRN
  Administered 2020-10-04: 4 mg via INTRAVENOUS

## 2020-10-04 MED ORDER — FENTANYL CITRATE (PF) 100 MCG/2ML IJ SOLN: 25.0000 ug | INTRAMUSCULAR | Status: DC | PRN

## 2020-10-04 MED ORDER — MIDAZOLAM HCL 2 MG/2ML IJ SOLN
INTRAMUSCULAR | Status: AC
  Filled 2020-10-04: qty 2

## 2020-10-04 MED ORDER — EPHEDRINE SULFATE 50 MG/ML IJ SOLN
INTRAMUSCULAR | Status: DC | PRN
  Administered 2020-10-04: 20 mg via INTRAVENOUS

## 2020-10-04 MED ORDER — DEXAMETHASONE SODIUM PHOSPHATE 10 MG/ML IJ SOLN
INTRAMUSCULAR | Status: DC | PRN
  Administered 2020-10-04: 4 mg via INTRAVENOUS

## 2020-10-04 MED ORDER — LIDOCAINE HCL 1 % IJ SOLN
INTRAMUSCULAR | Status: DC | PRN
  Administered 2020-10-04: 60 mg via INTRADERMAL

## 2020-10-04 MED ORDER — LACTATED RINGERS IV SOLN: INTRAVENOUS | Status: DC

## 2020-10-04 MED ORDER — PROPOFOL 10 MG/ML IV BOLUS
INTRAVENOUS | Status: AC
  Filled 2020-10-04: qty 20

## 2020-10-04 MED ORDER — FENTANYL CITRATE (PF) 100 MCG/2ML IJ SOLN
INTRAMUSCULAR | Status: AC
  Filled 2020-10-04: qty 2

## 2020-10-04 MED ORDER — MIDAZOLAM HCL 5 MG/5ML IJ SOLN
INTRAMUSCULAR | Status: DC | PRN
  Administered 2020-10-04: 2 mg via INTRAVENOUS

## 2020-10-04 MED ORDER — OXYCODONE HCL 5 MG/5ML PO SOLN
5.0000 mg | Freq: Once | ORAL | Status: AC | PRN
Start: 2020-10-04 — End: 2020-10-04

## 2020-10-04 MED ORDER — CEFAZOLIN SODIUM-DEXTROSE 2-4 GM/100ML-% IV SOLN
2.0000 g | INTRAVENOUS | Status: AC
  Administered 2020-10-04: 2 g via INTRAVENOUS

## 2020-10-04 MED ORDER — OXYCODONE HCL 5 MG PO TABS
5.0000 mg | ORAL_TABLET | Freq: Once | ORAL | Status: AC | PRN
  Administered 2020-10-04: 5 mg via ORAL

## 2020-10-04 SURGICAL SUPPLY — 56 items
ADH SKN CLS APL DERMABOND .7 (GAUZE/BANDAGES/DRESSINGS) ×1
APL PRP STRL LF DISP 70% ISPRP (MISCELLANEOUS) ×1
APPLIER CLIP 9.375 MED OPEN (MISCELLANEOUS)
APR CLP MED 9.3 20 MLT OPN (MISCELLANEOUS)
BINDER BREAST LRG (GAUZE/BANDAGES/DRESSINGS) IMPLANT
BINDER BREAST MEDIUM (GAUZE/BANDAGES/DRESSINGS) IMPLANT
BINDER BREAST XLRG (GAUZE/BANDAGES/DRESSINGS) ×1 IMPLANT
BINDER BREAST XXLRG (GAUZE/BANDAGES/DRESSINGS) IMPLANT
BLADE SURG 15 STRL LF DISP TIS (BLADE) ×1 IMPLANT
BLADE SURG 15 STRL SS (BLADE) ×2
CANISTER SUC SOCK COL 7IN (MISCELLANEOUS) IMPLANT
CANISTER SUCT 1200ML W/VALVE (MISCELLANEOUS) IMPLANT
CHLORAPREP W/TINT 26 (MISCELLANEOUS) ×2 IMPLANT
CLIP APPLIE 9.375 MED OPEN (MISCELLANEOUS) IMPLANT
CLIP VESOCCLUDE SM WIDE 6/CT (CLIP) IMPLANT
COVER BACK TABLE 60X90IN (DRAPES) ×2 IMPLANT
COVER MAYO STAND STRL (DRAPES) ×2 IMPLANT
COVER PROBE W GEL 5X96 (DRAPES) ×2 IMPLANT
COVER WAND RF STERILE (DRAPES) IMPLANT
DECANTER SPIKE VIAL GLASS SM (MISCELLANEOUS) IMPLANT
DERMABOND ADVANCED (GAUZE/BANDAGES/DRESSINGS) ×1
DERMABOND ADVANCED .7 DNX12 (GAUZE/BANDAGES/DRESSINGS) ×1 IMPLANT
DRAPE LAPAROSCOPIC ABDOMINAL (DRAPES) ×2 IMPLANT
DRAPE UTILITY XL STRL (DRAPES) ×2 IMPLANT
DRSG TEGADERM 4X4.75 (GAUZE/BANDAGES/DRESSINGS) IMPLANT
ELECT COATED BLADE 2.86 ST (ELECTRODE) ×2 IMPLANT
ELECT REM PT RETURN 9FT ADLT (ELECTROSURGICAL) ×2
ELECTRODE REM PT RTRN 9FT ADLT (ELECTROSURGICAL) ×1 IMPLANT
GAUZE SPONGE 4X4 12PLY STRL LF (GAUZE/BANDAGES/DRESSINGS) IMPLANT
GLOVE SURG ENC MOIS LTX SZ7 (GLOVE) ×4 IMPLANT
GLOVE SURG UNDER POLY LF SZ7.5 (GLOVE) ×2 IMPLANT
GOWN STRL REUS W/ TWL LRG LVL3 (GOWN DISPOSABLE) ×2 IMPLANT
GOWN STRL REUS W/TWL LRG LVL3 (GOWN DISPOSABLE) ×4
HEMOSTAT ARISTA ABSORB 3G PWDR (HEMOSTASIS) IMPLANT
KIT MARKER MARGIN INK (KITS) ×2 IMPLANT
NDL HYPO 25X1 1.5 SAFETY (NEEDLE) ×1 IMPLANT
NEEDLE HYPO 25X1 1.5 SAFETY (NEEDLE) ×2 IMPLANT
NS IRRIG 1000ML POUR BTL (IV SOLUTION) IMPLANT
PACK BASIN DAY SURGERY FS (CUSTOM PROCEDURE TRAY) ×2 IMPLANT
PENCIL SMOKE EVACUATOR (MISCELLANEOUS) ×2 IMPLANT
RETRACTOR ONETRAX LX 90X20 (MISCELLANEOUS) IMPLANT
SLEEVE SCD COMPRESS KNEE MED (STOCKING) ×2 IMPLANT
SPONGE LAP 4X18 RFD (DISPOSABLE) ×2 IMPLANT
STRIP CLOSURE SKIN 1/2X4 (GAUZE/BANDAGES/DRESSINGS) ×2 IMPLANT
SUT MNCRL AB 4-0 PS2 18 (SUTURE) ×1 IMPLANT
SUT MON AB 5-0 PS2 18 (SUTURE) IMPLANT
SUT SILK 2 0 SH (SUTURE) IMPLANT
SUT VIC AB 2-0 SH 27 (SUTURE) ×2
SUT VIC AB 2-0 SH 27XBRD (SUTURE) ×1 IMPLANT
SUT VIC AB 3-0 SH 27 (SUTURE) ×2
SUT VIC AB 3-0 SH 27X BRD (SUTURE) ×1 IMPLANT
SYR CONTROL 10ML LL (SYRINGE) ×2 IMPLANT
TOWEL GREEN STERILE FF (TOWEL DISPOSABLE) ×2 IMPLANT
TRAY FAXITRON CT DISP (TRAY / TRAY PROCEDURE) ×2 IMPLANT
TUBE CONNECTING 20X1/4 (TUBING) IMPLANT
YANKAUER SUCT BULB TIP NO VENT (SUCTIONS) IMPLANT

## 2020-10-04 NOTE — Discharge Instructions (Signed)
[place] Office Phone Number [phone]  BREAST BIOPSY/ PARTIAL MASTECTOMY: POST OP INSTRUCTIONS Take 400 mg of ibuprofen every 8 hours or 650 mg tylenol every 6 hours for next 72 hours then as needed. Use ice several times daily also. Always review your discharge instruction sheet given to you by the facility where your surgery was performed.  IF YOU HAVE DISABILITY OR FAMILY LEAVE FORMS, YOU MUST BRING THEM TO THE OFFICE FOR PROCESSING.  DO NOT GIVE THEM TO YOUR DOCTOR.  1. A prescription for pain medication may be given to you upon discharge.  Take your pain medication as prescribed, if needed.  If narcotic pain medicine is not needed, then you may take acetaminophen (Tylenol), naprosyn (Alleve) or ibuprofen (Advil) as needed. *No tylenol until after 6:15pm 2. Take your usually prescribed medications unless otherwise directed 3. If you need a refill on your pain medication, please contact your pharmacy.  They will contact our office to request authorization.  Prescriptions will not be filled after 5pm or on week-ends. 4. You should eat very light the first 24 hours after surgery, such as soup, crackers, pudding, etc.  Resume your normal diet the day after surgery. 5. Most patients will experience some swelling and bruising in the breast.  Ice packs and a good support bra will help.  Wear the breast binder provided or a sports bra for 72 hours day and night.  After that wear a sports bra during the day until you return to the office. Swelling and bruising can take several days to resolve.  6. It is common to experience some constipation if taking pain medication after surgery.  Increasing fluid intake and taking a stool softener will usually help or prevent this problem from occurring.  A mild laxative (Milk of Magnesia or Miralax) should be taken according to package directions if there are no bowel movements after 48 hours. 7. Unless discharge instructions indicate otherwise,  you may remove your bandages 48 hours after surgery and you may shower at that time.  You may have steri-strips (small skin tapes) in place directly over the incision.  These strips should be left on the skin for 7-10 days and will come off on their own.  If your surgeon used skin glue on the incision, you may shower in 24 hours.  The glue will flake off over the next 2-3 weeks.  Any sutures or staples will be removed at the office during your follow-up visit. 8. ACTIVITIES:  You may resume regular daily activities (gradually increasing) beginning the next day.  Wearing a good support bra or sports bra minimizes pain and swelling.  You may have sexual intercourse when it is comfortable. a. You may drive when you no longer are taking prescription pain medication, you can comfortably wear a seatbelt, and you can safely maneuver your car and apply brakes. b. RETURN TO WORK:  ______________________________________________________________________________________ 9. You should see your doctor in the office for a follow-up appointment approximately two weeks after your surgery.  Your doctor's nurse will typically make your follow-up appointment when she calls you with your pathology report.  Expect your pathology report 3-4 business days after your surgery.  You may call to check if you do not hear from [place] after three days. 10. OTHER INSTRUCTIONS: _______________________________________________________________________________________________ _____________________________________________________________________________________________________________________________________ _____________________________________________________________________________________________________________________________________ _____________________________________________________________________________________________________________________________________  WHEN TO CALL DR WAKEFIELD: 1. Fever over 101.0 2. Nausea and/or  vomiting. 3. Extreme swelling or bruising. 4. Continued bleeding from incision. 5. Increased pain, redness, or drainage from  the incision.  The clinic staff is available to answer your questions during regular business hours.  Please don't hesitate to call and ask to speak to one of the nurses for clinical concerns.  If you have a medical emergency, go to the nearest emergency room or call 911.  A surgeon from [facility] is always on call at the hospital.  For further questions, please visit [email or web address]   Post Anesthesia Home Care Instructions  Activity: Get plenty of rest for the remainder of the day. A responsible individual must stay with you for 24 hours following the procedure.  For the next 24 hours, DO NOT: -Drive a car -Paediatric nurse -Drink alcoholic beverages -Take any medication unless instructed by your physician -Make any legal decisions or sign important papers.  Meals: Start with liquid foods such as gelatin or soup. Progress to regular foods as tolerated. Avoid greasy, spicy, heavy foods. If nausea and/or vomiting occur, drink only clear liquids until the nausea and/or vomiting subsides. Call your physician if vomiting continues.  Special Instructions/Symptoms: Your throat may feel dry or sore from the anesthesia or the breathing tube placed in your throat during surgery. If this causes discomfort, gargle with warm salt water. The discomfort should disappear within 24 hours.  If you had a scopolamine patch placed behind your ear for the management of post- operative nausea and/or vomiting:  1. The medication in the patch is effective for 72 hours, after which it should be removed.  Wrap patch in a tissue and discard in the trash. Wash hands thoroughly with soap and water. 2. You may remove the patch earlier than 72 hours if you experience unpleasant side effects which may include dry mouth, dizziness or visual disturbances. 3. Avoid  touching the patch. Wash your hands with soap and water after contact with the patch.

## 2020-10-04 NOTE — Anesthesia Procedure Notes (Signed)
Procedure Name: LMA Insertion Date/Time: 10/04/2020 1:14 PM Performed by: Signe Colt, CRNA Pre-anesthesia Checklist: Patient identified, Emergency Drugs available, Suction available and Patient being monitored Patient Re-evaluated:Patient Re-evaluated prior to induction Oxygen Delivery Method: Circle System Utilized Preoxygenation: Pre-oxygenation with 100% oxygen Induction Type: IV induction Ventilation: Mask ventilation without difficulty LMA: LMA inserted LMA Size: 4.0 Number of attempts: 1 Airway Equipment and Method: bite block Placement Confirmation: positive ETCO2 Tube secured with: Tape Dental Injury: Teeth and Oropharynx as per pre-operative assessment

## 2020-10-04 NOTE — Anesthesia Preprocedure Evaluation (Addendum)
Anesthesia Evaluation  Patient identified by MRN, date of birth, ID band Patient awake    Reviewed: Allergy & Precautions, NPO status , Patient's Chart, lab work & pertinent test results, reviewed documented beta blocker date and time   Airway Mallampati: II  TM Distance: >3 FB Neck ROM: Full    Dental no notable dental hx.    Pulmonary neg pulmonary ROS,    Pulmonary exam normal breath sounds clear to auscultation       Cardiovascular hypertension, Pt. on medications and Pt. on home beta blockers Normal cardiovascular exam Rhythm:Regular Rate:Normal  EKG 10/02/20 NSR, LVH   Neuro/Psych  Headaches, negative psych ROS   GI/Hepatic Neg liver ROS, GERD  Medicated and Controlled,  Endo/Other  diabetes, Well Controlled, Type 2Abnormal mammogram right breast Hyperlipidemia  Renal/GU negative Renal ROS  negative genitourinary   Musculoskeletal  (+) Arthritis , Osteoarthritis,    Abdominal   Peds  Hematology   Anesthesia Other Findings   Reproductive/Obstetrics                           Anesthesia Physical Anesthesia Plan  ASA: III  Anesthesia Plan: General   Post-op Pain Management:    Induction: Intravenous  PONV Risk Score and Plan: 4 or greater and Treatment may vary due to age or medical condition, Ondansetron and Midazolam  Airway Management Planned: LMA  Additional Equipment:   Intra-op Plan:   Post-operative Plan: Extubation in OR  Informed Consent: I have reviewed the patients History and Physical, chart, labs and discussed the procedure including the risks, benefits and alternatives for the proposed anesthesia with the patient or authorized representative who has indicated his/her understanding and acceptance.     Dental advisory given  Plan Discussed with: CRNA and Anesthesiologist  Anesthesia Plan Comments:        Anesthesia Quick Evaluation

## 2020-10-04 NOTE — Interval H&P Note (Signed)
History and Physical Interval Note:  10/04/2020 1:00 PM  Carrie Woodward  has presented today for surgery, with the diagnosis of RIGHT BREAST MAMMOGRAPHIC ABNORMALITIES.  The various methods of treatment have been discussed with the patient and family. After consideration of risks, benefits and other options for treatment, the patient has consented to  Procedure(s): RADIOACTIVE SEED GUIDED EXCISIONAL RIGHT BREAST BIOPSY X 2 (Right) as a surgical intervention.  The patient's history has been reviewed, patient examined, no change in status, stable for surgery.  I have reviewed the patient's chart and labs.  Questions were answered to the patient's satisfaction.     Rolm Bookbinder

## 2020-10-04 NOTE — Transfer of Care (Signed)
Immediate Anesthesia Transfer of Care Note  Patient: Carrie Woodward  Procedure(s) Performed: RADIOACTIVE SEED GUIDED EXCISIONAL RIGHT BREAST BIOPSY (Right Breast)  Patient Location: PACU  Anesthesia Type:General  Level of Consciousness: drowsy and patient cooperative  Airway & Oxygen Therapy: Patient Spontanous Breathing and Patient connected to face mask oxygen  Post-op Assessment: Report given to RN and Post -op Vital signs reviewed and stable  Post vital signs: Reviewed and stable  Last Vitals:  Vitals Value Taken Time  BP    Temp    Pulse 83 10/04/20 1352  Resp 18 10/04/20 1352  SpO2 98 % 10/04/20 1352  Vitals shown include unvalidated device data.  Last Pain:  Vitals:   10/04/20 1207  TempSrc: Oral  PainSc: 0-No pain      Patients Stated Pain Goal: 4 (97/47/18 5501)  Complications: No complications documented.

## 2020-10-04 NOTE — Op Note (Signed)
Preoperative diagnosis: Right breast mass x2 with core biopsies as fibroadenomas that are discordant Postoperative diagnosis: Same as above Procedure: Right breast mass excisional biopsy with radioactive seed guidance x2 Surgeon: Dr. Serita Grammes Anesthesia: General Estimated blood loss: Minimal Specimens: Right breast tissue marked with paint containing clips and 2 radioactive seeds Complications: None Drains: None Special count was correct completion Decision to recovery stable condition  Indications:65 yof who presents with abnormal screening mm.  she has b density breasts. she was found to have bilateral calcs on screen. dx views showed right uo1 2.1 cm calcs, 2 cm calcs uoq of left breast. she underwent two biopsies on the right side and then one on the left side. the left is fa change and concordant. the right are both fibroadenomas with calcs and read as discordant.  We discussed proceeding with excision of the 2 right-sided lesions with radioactive seed guidance.  Procedure: After informed consent was obtained the patient was taken to the operating room.  She had 2 radioactive seeds in place prior to beginning and I confirmed this with mammography.  She was given antibiotics.  SCDs were in place.  She was placed under general esthesia without complication.  She was prepped and draped in the standard sterile surgical fashion.  Surgical timeout was then performed.  I located the seeds in the far lateral breast.  I elected to make a curvilinear incision overlying them in the very lateral breast.  I used the neoprobe to guide excision of both seeds.  I marked this with paint.  Mammogram confirmed removal of the clip and they both seeds.  I then obtained hemostasis.  I closed the breast tissue with 2-0 Vicryl.  The skin was closed with 3-0 Vicryl for Monocryl.  Glue Steri-Strips were applied.  She tolerated the well was transferred recovery stable.

## 2020-10-04 NOTE — Anesthesia Postprocedure Evaluation (Signed)
Anesthesia Post Note  Patient: Carrie Woodward  Procedure(s) Performed: RADIOACTIVE SEED GUIDED EXCISIONAL RIGHT BREAST BIOPSY (2 seeds) (Right Breast)     Patient location during evaluation: PACU Anesthesia Type: General Level of consciousness: awake and alert and oriented Pain management: pain level controlled Vital Signs Assessment: post-procedure vital signs reviewed and stable Respiratory status: spontaneous breathing, nonlabored ventilation and respiratory function stable Cardiovascular status: blood pressure returned to baseline and stable Postop Assessment: no apparent nausea or vomiting Anesthetic complications: no   No complications documented.  Last Vitals:  Vitals:   10/04/20 1415 10/04/20 1430  BP: (!) 145/71 (!) 146/71  Pulse: 79 82  Resp: 19 19  Temp:    SpO2: 100% 96%    Last Pain:  Vitals:   10/04/20 1415  TempSrc:   PainSc: 0-No pain                 Shareeka Yim A.

## 2020-10-04 NOTE — H&P (Signed)
   65 yof who presents with abnormal screening mm. she has fh in maternal aunt and 1st cousin. she has prior benign radiologic biopsy. she has no mass or dc. she has b density breasts. she was found to have bilateral calcs on screen. dx views showed right uo1 2.1 cm calcs, 2 cm calcs uoq of left breast. she underwent two biopsies on the right side and then one on the left side. the left is fa change and concordant. the right are both fibroadenomas with calcs and read as discordant. she was referred to discuss excision   Past Surgical History  Breast Biopsy  Bilateral. Cesarean Section - Multiple  Oral Surgery   Diagnostic Studies History Colonoscopy  never Mammogram  within last year Pap Smear  1-5 years ago  Allergies  No Known Drug Allergies  Allergies Reconciled   Medication History  Loratadine (10MG  Tablet, Oral) Active. HYDROcodone-Acetaminophen (5-325MG  Tablet, Oral) Active. Accu-Chek Guide (w/Device Kit,) Active. Accu-Chek Guide (In Vitro) Active. Accu-Chek Softclix Lancets Active. hydroCHLOROthiazide (25MG  Tablet, Oral) Active. Ibuprofen (600MG  Tablet, Oral) Active. Nebivolol HCl (5MG  Tablet, Oral) Active. Penicillin V Potassium (500MG  Tablet, Oral) Active. Telmisartan (40MG  Tablet, Oral) Active. Medications Reconciled  Pregnancy / Birth History  Age at menarche  24 years. Age of menopause  >62 Contraceptive History  Oral contraceptives. Gravida  3 Irregular periods  Maternal age  47-25 Para  2  Other Problems  Anxiety Disorder  Arthritis  Back Pain  Chest pain  Depression  Diabetes Mellitus  Gastroesophageal Reflux Disease  High blood pressure  Hypercholesterolemia  Sleep Apnea    Review of Systems General Not Present- Appetite Loss, Chills, Fatigue, Fever, Night Sweats, Weight Gain and Weight Loss. Skin Not Present- Change in Wart/Mole, Dryness, Hives, Jaundice, New Lesions, Non-Healing Wounds, Rash and  Ulcer. HEENT Present- Seasonal Allergies. Not Present- Earache, Hearing Loss, Hoarseness, Nose Bleed, Oral Ulcers, Ringing in the Ears, Sinus Pain, Sore Throat, Visual Disturbances, Wears glasses/contact lenses and Yellow Eyes. Respiratory Present- Snoring. Not Present- Bloody sputum, Chronic Cough, Difficulty Breathing and Wheezing. Breast Present- Breast Pain. Not Present- Breast Mass, Nipple Discharge and Skin Changes. Cardiovascular Present- Leg Cramps. Not Present- Chest Pain, Difficulty Breathing Lying Down, Palpitations, Rapid Heart Rate, Shortness of Breath and Swelling of Extremities. Gastrointestinal Present- Gets full quickly at meals. Not Present- Abdominal Pain, Bloating, Bloody Stool, Change in Bowel Habits, Chronic diarrhea, Constipation, Difficulty Swallowing, Excessive gas, Hemorrhoids, Indigestion, Nausea, Rectal Pain and Vomiting. Female Genitourinary Present- Nocturia. Not Present- Frequency, Painful Urination, Pelvic Pain and Urgency. Neurological Present- Numbness and Tingling. Not Present- Decreased Memory, Fainting, Headaches, Seizures, Tremor, Trouble walking and Weakness. Psychiatric Present- Change in Sleep Pattern. Not Present- Anxiety, Bipolar, Depression, Fearful and Frequent crying. Endocrine Present- Hot flashes. Not Present- Cold Intolerance, Excessive Hunger, Hair Changes, Heat Intolerance and New Diabetes. Hematology Present- Easy Bruising. Not Present- Blood Thinners, Excessive bleeding, Gland problems, HIV and Persistent Infections.   Physical Exam  General Mental Status-Alert. Orientation-Oriented X3. Breast Nipples-No Discharge. Breast Lump-No Palpable Breast Mass. Lymphatic Head & Neck General Head & Neck Lymphatics: Bilateral - Description - Normal. Axillary General Axillary Region: Bilateral - Description - Normal. Note: no Homer adenopathy   Assessment & Plan  CALCIFICATION OF RIGHT BREAST ON MAMMOGRAPHY (R92.1) Story: Right breast seed  guided excisional biopsy times two We discussed observation vs surgery. she would like to proceed with excision which we discussed with seed guidance. risks, recovery both discussed.

## 2020-10-05 ENCOUNTER — Encounter (HOSPITAL_BASED_OUTPATIENT_CLINIC_OR_DEPARTMENT_OTHER): Payer: Self-pay | Admitting: General Surgery

## 2020-10-06 LAB — SURGICAL PATHOLOGY

## 2021-08-07 ENCOUNTER — Encounter (HOSPITAL_COMMUNITY): Payer: Self-pay

## 2022-07-16 DIAGNOSIS — Z6825 Body mass index (BMI) 25.0-25.9, adult: Secondary | ICD-10-CM | POA: Diagnosis not present

## 2022-07-16 DIAGNOSIS — E1165 Type 2 diabetes mellitus with hyperglycemia: Secondary | ICD-10-CM | POA: Diagnosis not present

## 2022-07-16 DIAGNOSIS — E7800 Pure hypercholesterolemia, unspecified: Secondary | ICD-10-CM | POA: Diagnosis not present

## 2022-07-16 DIAGNOSIS — L84 Corns and callosities: Secondary | ICD-10-CM | POA: Diagnosis not present

## 2022-07-16 DIAGNOSIS — E1142 Type 2 diabetes mellitus with diabetic polyneuropathy: Secondary | ICD-10-CM | POA: Diagnosis not present

## 2022-07-16 DIAGNOSIS — E785 Hyperlipidemia, unspecified: Secondary | ICD-10-CM | POA: Diagnosis not present

## 2022-07-16 DIAGNOSIS — I10 Essential (primary) hypertension: Secondary | ICD-10-CM | POA: Diagnosis not present

## 2022-07-18 ENCOUNTER — Other Ambulatory Visit: Payer: Self-pay

## 2022-07-18 ENCOUNTER — Emergency Department (HOSPITAL_COMMUNITY): Payer: Medicare HMO

## 2022-07-18 ENCOUNTER — Observation Stay (HOSPITAL_COMMUNITY)
Admission: EM | Admit: 2022-07-18 | Discharge: 2022-07-19 | Disposition: A | Discharge status: Home / Self Care | Payer: Medicare HMO | Attending: Internal Medicine | Admitting: Internal Medicine

## 2022-07-18 ENCOUNTER — Encounter (HOSPITAL_COMMUNITY): Payer: Self-pay | Admitting: Internal Medicine

## 2022-07-18 DIAGNOSIS — H8190 Unspecified disorder of vestibular function, unspecified ear: Secondary | ICD-10-CM | POA: Diagnosis present

## 2022-07-18 DIAGNOSIS — Z7982 Long term (current) use of aspirin: Secondary | ICD-10-CM | POA: Diagnosis not present

## 2022-07-18 DIAGNOSIS — I10 Essential (primary) hypertension: Secondary | ICD-10-CM | POA: Diagnosis not present

## 2022-07-18 DIAGNOSIS — E1165 Type 2 diabetes mellitus with hyperglycemia: Secondary | ICD-10-CM | POA: Diagnosis not present

## 2022-07-18 DIAGNOSIS — K219 Gastro-esophageal reflux disease without esophagitis: Secondary | ICD-10-CM | POA: Diagnosis present

## 2022-07-18 DIAGNOSIS — R42 Dizziness and giddiness: Principal | ICD-10-CM | POA: Insufficient documentation

## 2022-07-18 DIAGNOSIS — R1111 Vomiting without nausea: Secondary | ICD-10-CM | POA: Diagnosis not present

## 2022-07-18 DIAGNOSIS — E876 Hypokalemia: Secondary | ICD-10-CM | POA: Diagnosis not present

## 2022-07-18 DIAGNOSIS — T17920A Food in respiratory tract, part unspecified causing asphyxiation, initial encounter: Secondary | ICD-10-CM | POA: Diagnosis not present

## 2022-07-18 DIAGNOSIS — E785 Hyperlipidemia, unspecified: Secondary | ICD-10-CM | POA: Diagnosis not present

## 2022-07-18 DIAGNOSIS — Z79899 Other long term (current) drug therapy: Secondary | ICD-10-CM | POA: Insufficient documentation

## 2022-07-18 DIAGNOSIS — R110 Nausea: Secondary | ICD-10-CM | POA: Diagnosis not present

## 2022-07-18 LAB — CBC WITH DIFFERENTIAL/PLATELET
Abs Immature Granulocytes: 0.07 10*3/uL (ref 0.00–0.07)
Basophils Absolute: 0 10*3/uL (ref 0.0–0.1)
Basophils Relative: 0 %
Eosinophils Absolute: 0.1 10*3/uL (ref 0.0–0.5)
Eosinophils Relative: 1 %
HCT: 37.6 % (ref 36.0–46.0)
Hemoglobin: 12.5 g/dL (ref 12.0–15.0)
Immature Granulocytes: 1 %
Lymphocytes Relative: 22 %
Lymphs Abs: 2.3 10*3/uL (ref 0.7–4.0)
MCH: 28.5 pg (ref 26.0–34.0)
MCHC: 33.2 g/dL (ref 30.0–36.0)
MCV: 85.6 fL (ref 80.0–100.0)
Monocytes Absolute: 0.7 10*3/uL (ref 0.1–1.0)
Monocytes Relative: 6 %
Neutro Abs: 7.2 10*3/uL (ref 1.7–7.7)
Neutrophils Relative %: 70 %
Platelets: 262 10*3/uL (ref 150–400)
RBC: 4.39 MIL/uL (ref 3.87–5.11)
RDW: 13.5 % (ref 11.5–15.5)
WBC: 10.3 10*3/uL (ref 4.0–10.5)
nRBC: 0 % (ref 0.0–0.2)

## 2022-07-18 LAB — GLUCOSE, CAPILLARY
Glucose-Capillary: 122 mg/dL — ABNORMAL HIGH (ref 70–99)
Glucose-Capillary: 126 mg/dL — ABNORMAL HIGH (ref 70–99)
Glucose-Capillary: 139 mg/dL — ABNORMAL HIGH (ref 70–99)

## 2022-07-18 LAB — PHOSPHORUS: Phosphorus: 3.2 mg/dL (ref 2.5–4.6)

## 2022-07-18 LAB — COMPREHENSIVE METABOLIC PANEL
ALT: 32 U/L (ref 0–44)
AST: 25 U/L (ref 15–41)
Albumin: 4 g/dL (ref 3.5–5.0)
Alkaline Phosphatase: 90 U/L (ref 38–126)
Anion gap: 12 (ref 5–15)
BUN: 17 mg/dL (ref 8–23)
CO2: 23 mmol/L (ref 22–32)
Calcium: 8.9 mg/dL (ref 8.9–10.3)
Chloride: 102 mmol/L (ref 98–111)
Creatinine, Ser: 0.72 mg/dL (ref 0.44–1.00)
GFR, Estimated: >60 mL/min (ref >60)
Glucose, Bld: 255 mg/dL — ABNORMAL HIGH (ref 70–99)
Potassium: 2.6 mmol/L — CL (ref 3.5–5.1)
Sodium: 137 mmol/L (ref 135–145)
Total Bilirubin: 0.7 mg/dL (ref 0.3–1.2)
Total Protein: 7.5 g/dL (ref 6.5–8.1)

## 2022-07-18 LAB — URINALYSIS, ROUTINE W REFLEX MICROSCOPIC
Bacteria, UA: NONE SEEN
Bilirubin Urine: NEGATIVE
Glucose, UA: >=500 mg/dL — AB
Hgb urine dipstick: NEGATIVE
Ketones, ur: 5 mg/dL — AB
Leukocytes,Ua: NEGATIVE
Nitrite: NEGATIVE
Protein, ur: NEGATIVE mg/dL
Specific Gravity, Urine: 1.016 (ref 1.005–1.030)
pH: 7 (ref 5.0–8.0)

## 2022-07-18 LAB — BLOOD GAS, VENOUS
Acid-base deficit: 1.4 mmol/L (ref 0.0–2.0)
Bicarbonate: 25.2 mmol/L (ref 20.0–28.0)
O2 Saturation: 83.3 %
Patient temperature: 37
pCO2, Ven: 49 mmHg (ref 44–60)
pH, Ven: 7.32 (ref 7.25–7.43)
pO2, Ven: 49 mmHg — ABNORMAL HIGH (ref 32–45)

## 2022-07-18 LAB — LIPASE, BLOOD: Lipase: 33 U/L (ref 11–51)

## 2022-07-18 LAB — CBG MONITORING, ED: Glucose-Capillary: 226 mg/dL — ABNORMAL HIGH (ref 70–99)

## 2022-07-18 LAB — MAGNESIUM: Magnesium: 2 mg/dL (ref 1.7–2.4)

## 2022-07-18 LAB — BETA-HYDROXYBUTYRIC ACID: Beta-Hydroxybutyric Acid: 0.24 mmol/L (ref 0.05–0.27)

## 2022-07-18 MED ORDER — DIAZEPAM 5 MG/ML IJ SOLN
2.5000 mg | Freq: Once | INTRAMUSCULAR | Status: AC
  Administered 2022-07-18: 2.5 mg via INTRAVENOUS
  Filled 2022-07-18: qty 2

## 2022-07-18 MED ORDER — PROCHLORPERAZINE EDISYLATE 10 MG/2ML IJ SOLN
10.0000 mg | Freq: Once | INTRAMUSCULAR | Status: AC
  Administered 2022-07-18: 10 mg via INTRAVENOUS
  Filled 2022-07-18: qty 2

## 2022-07-18 MED ORDER — ACETAMINOPHEN 325 MG PO TABS
650.0000 mg | ORAL_TABLET | Freq: Four times a day (QID) | ORAL | Status: DC | PRN
  Administered 2022-07-19: 650 mg via ORAL
  Filled 2022-07-18: qty 2

## 2022-07-18 MED ORDER — POTASSIUM CHLORIDE IN NACL 40-0.9 MEQ/L-% IV SOLN
INTRAVENOUS | Status: AC
  Filled 2022-07-18: qty 1000

## 2022-07-18 MED ORDER — EMPAGLIFLOZIN 25 MG PO TABS
25.0000 mg | ORAL_TABLET | Freq: Every day | ORAL | Status: DC
  Administered 2022-07-19: 25 mg via ORAL
  Filled 2022-07-18: qty 1

## 2022-07-18 MED ORDER — MECLIZINE HCL 25 MG PO TABS
25.0000 mg | ORAL_TABLET | Freq: Once | ORAL | Status: AC
  Administered 2022-07-18: 25 mg via ORAL
  Filled 2022-07-18: qty 1

## 2022-07-18 MED ORDER — SODIUM CHLORIDE 0.9 % IV BOLUS
1000.0000 mL | Freq: Once | INTRAVENOUS | Status: AC
  Administered 2022-07-18: 1000 mL via INTRAVENOUS

## 2022-07-18 MED ORDER — MAGNESIUM SULFATE 2 GM/50ML IV SOLN
2.0000 g | Freq: Once | INTRAVENOUS | Status: AC
  Administered 2022-07-18: 2 g via INTRAVENOUS
  Filled 2022-07-18: qty 50

## 2022-07-18 MED ORDER — INSULIN ASPART 100 UNIT/ML IJ SOLN
0.0000 [IU] | Freq: Three times a day (TID) | INTRAMUSCULAR | Status: DC
  Administered 2022-07-18: 1 [IU] via SUBCUTANEOUS
  Administered 2022-07-19: 2 [IU] via SUBCUTANEOUS
  Administered 2022-07-19: 1 [IU] via SUBCUTANEOUS
  Filled 2022-07-18: qty 0.09

## 2022-07-18 MED ORDER — ONDANSETRON HCL 4 MG PO TABS
4.0000 mg | ORAL_TABLET | Freq: Four times a day (QID) | ORAL | Status: DC | PRN
  Administered 2022-07-18: 4 mg via ORAL
  Filled 2022-07-18: qty 1

## 2022-07-18 MED ORDER — ONDANSETRON 4 MG PO TBDP: 4.0000 mg | ORAL_TABLET | Freq: Three times a day (TID) | ORAL | 0 refills | Status: DC | PRN

## 2022-07-18 MED ORDER — POTASSIUM CHLORIDE CRYS ER 20 MEQ PO TBCR
40.0000 meq | EXTENDED_RELEASE_TABLET | Freq: Once | ORAL | Status: AC
  Administered 2022-07-18: 40 meq via ORAL
  Filled 2022-07-18: qty 2

## 2022-07-18 MED ORDER — ATORVASTATIN CALCIUM 10 MG PO TABS
20.0000 mg | ORAL_TABLET | Freq: Every day | ORAL | Status: DC
  Administered 2022-07-18 – 2022-07-19 (×2): 20 mg via ORAL
  Filled 2022-07-18 (×2): qty 2

## 2022-07-18 MED ORDER — ASPIRIN 81 MG PO TBEC
81.0000 mg | DELAYED_RELEASE_TABLET | Freq: Every day | ORAL | Status: DC
  Administered 2022-07-19: 81 mg via ORAL
  Filled 2022-07-18: qty 1

## 2022-07-18 MED ORDER — POTASSIUM CHLORIDE IN NACL 40-0.9 MEQ/L-% IV SOLN: INTRAVENOUS | Status: DC

## 2022-07-18 MED ORDER — SODIUM CHLORIDE 0.9 % IV SOLN: INTRAVENOUS | Status: DC

## 2022-07-18 MED ORDER — IRBESARTAN 150 MG PO TABS
150.0000 mg | ORAL_TABLET | Freq: Every day | ORAL | Status: DC
  Administered 2022-07-19: 150 mg via ORAL
  Filled 2022-07-18: qty 1

## 2022-07-18 MED ORDER — ACETAMINOPHEN 650 MG RE SUPP: 650.0000 mg | Freq: Four times a day (QID) | RECTAL | Status: DC | PRN

## 2022-07-18 MED ORDER — MECLIZINE HCL 25 MG PO TABS: 25.0000 mg | ORAL_TABLET | Freq: Three times a day (TID) | ORAL | Status: DC | PRN

## 2022-07-18 MED ORDER — LORAZEPAM 2 MG/ML IJ SOLN
0.5000 mg | Freq: Once | INTRAMUSCULAR | Status: AC
  Administered 2022-07-18: 0.5 mg via INTRAVENOUS
  Filled 2022-07-18: qty 1

## 2022-07-18 MED ORDER — ORAL CARE MOUTH RINSE: 15.0000 mL | OROMUCOSAL | Status: DC | PRN

## 2022-07-18 MED ORDER — PANTOPRAZOLE SODIUM 40 MG PO TBEC
40.0000 mg | DELAYED_RELEASE_TABLET | Freq: Every day | ORAL | Status: DC
  Administered 2022-07-19: 40 mg via ORAL
  Filled 2022-07-18: qty 1

## 2022-07-18 MED ORDER — PANTOPRAZOLE SODIUM 40 MG IV SOLR
40.0000 mg | Freq: Once | INTRAVENOUS | Status: AC
  Administered 2022-07-18: 40 mg via INTRAVENOUS
  Filled 2022-07-18: qty 10

## 2022-07-18 MED ORDER — ONDANSETRON HCL 4 MG/2ML IJ SOLN: 4.0000 mg | Freq: Four times a day (QID) | INTRAMUSCULAR | Status: DC | PRN

## 2022-07-18 MED ORDER — ENOXAPARIN SODIUM 40 MG/0.4ML IJ SOSY
40.0000 mg | PREFILLED_SYRINGE | Freq: Every day | INTRAMUSCULAR | Status: DC
  Administered 2022-07-19: 40 mg via SUBCUTANEOUS
  Filled 2022-07-18 (×2): qty 0.4

## 2022-07-18 MED ORDER — POTASSIUM CHLORIDE 10 MEQ/100ML IV SOLN
10.0000 meq | INTRAVENOUS | Status: AC
  Administered 2022-07-18 (×2): 10 meq via INTRAVENOUS
  Filled 2022-07-18 (×2): qty 100

## 2022-07-18 MED ORDER — MECLIZINE HCL 25 MG PO TABS: 25.0000 mg | ORAL_TABLET | Freq: Three times a day (TID) | ORAL | 0 refills | Status: DC | PRN

## 2022-07-18 MED ORDER — SODIUM CHLORIDE 0.9 % IV BOLUS
250.0000 mL | Freq: Once | INTRAVENOUS | Status: AC
  Administered 2022-07-18: 250 mL via INTRAVENOUS

## 2022-07-18 NOTE — H&P (Signed)
History and Physical    Patient: Carrie Woodward [id number] DOB: [birth year] DOA: 07/18/2022 DOS: the patient was seen and examined on 07/18/2022 PCP: Iona Beard, MD  Patient coming from: Home  Chief Complaint:  Chief Complaint  Patient presents with   Emesis   Dizziness   HPI: Carrie Woodward is a 67 y.o. female with medical history significant of seasonal allergies, osteoarthritis, type 2 diabetes, GERD, headaches, hypertension who presented to the emergency department with sudden onset of dizziness and emesis that worsens with movement or changing position.  She received 4 mg of Zofran and 1 L of LR with EMS.  Even with this, she was still vomiting when she came to the emergency department and was moving to the stretcher.  There is no previous history of vertigo.    No diarrhea, constipation, melena or hematochezia.  No flank pain, dysuria, frequency or hematuria.No fever, chills or night sweats. No sore throat, rhinorrhea, dyspnea, wheezing or hemoptysis.  No chest pain, palpitations, diaphoresis, PND, orthopnea or pitting edema of the lower extremities.  No polyuria, polydipsia, polyphagia or blurred vision.  Lab work: Her urinalysis showed glucosuria more than 500 and ketonuria 5 mg/dL.  CBC, beta hydroxybutyric acid and lipase were normal.  Venous blood gas showed an increased pO2 of 49 mmHg, but the rest of the measurements were normal.  CMP with a potassium of 2.6 mmol/L and a glucose of 255 mg/dL, but otherwise unremarkable.  Imaging: CT head without contrast was normal for age.  MRI of the brain without contrast was slightly motion degraded but otherwise appeared normal for age.  ED course: Initial vital signs were temperature 97.7 F, pulse 92, respiration 19, BP 165/73 mmHg O2 sat 100% on room air.  The patient received normal saline 250 mL bolus, KCl 10 mEq IVPB x 2, KCl 40 mEq p.o. x 1, meclizine 25 mg p.o. x 1, prochlorperazine 10 mg IVP, lorazepam 0.5 mg IVP x 1 and  diazepam 2.5 mg IVP x 1.   Review of Systems: As mentioned in the history of present illness. All other systems reviewed and are negative.  Past Medical History:  Diagnosis Date   Allergy    Arthritis    arms, hands   Diabetes mellitus 2011   oral agents   GERD (gastroesophageal reflux disease)    on meds   Headache(784.0)    Hypertension    Past Surgical History:  Procedure Laterality Date   CESAREAN SECTION  1982, 1987   LASER ABLATION OF THE CERVIX  1988   RADIOACTIVE SEED GUIDED EXCISIONAL BREAST BIOPSY Right 10/04/2020   Procedure: RADIOACTIVE SEED GUIDED EXCISIONAL RIGHT BREAST BIOPSY (2 seeds);  Surgeon: Rolm Bookbinder, MD;  Location: [facility];  Service: General;  Laterality: Right;   [place]  2012   Social History:  reports that she has never smoked. She has never used smokeless tobacco. She reports that she does not drink alcohol and does not use drugs.  No Known Allergies  Family History  Problem Relation Age of Onset   Diabetes Mother    Hypertension Mother    Hypertension Sister    Hypertension Brother    Diabetes Maternal Grandmother    Diabetes Paternal Grandfather     Prior to Admission medications   Medication Sig Start Date End Date Taking? Authorizing Provider  acetaminophen (TYLENOL) 500 MG tablet Take 500 mg by mouth every 8 (eight) hours as needed for mild pain.    [provider]  aspirin 81 MG tablet Take 81 mg by mouth daily with breakfast.    [provider]  benzonatate (TESSALON) 100 MG capsule Take 1-2 capsules (100-200 mg total) by mouth 3 (three) times daily as needed for cough. 12/12/14   Adams-Doolittle, Marland Kitchen, MD  Chlorpheniramine-DM (CORICIDIN HBP COUGH/COLD PO) Take 1 tablet by mouth daily as needed (cold/sinus symptoms).    [provider]  Empagliflozin-linaGLIPtin (GLYXAMBI) 25-5 MG TABS Take by mouth.    [provider]  hydrochlorothiazide (HYDRODIURIL) 12.5 MG  tablet Take 12.5 mg by mouth daily. 06/15/19   [provider]  HYDROcodone-homatropine (HYCODAN) 5-1.5 MG/5ML syrup Take 5 mLs by mouth every 8 (eight) hours as needed for cough. 12/12/14   Adams-Doolittle, Marland Kitchen, MD  ibuprofen (ADVIL,MOTRIN) 200 MG tablet Take 400 mg by mouth every 6 (six) hours as needed (pain).    [provider]  loratadine (CLARITIN) 10 MG tablet Take 10 mg by mouth daily.    [provider]  nebivolol (BYSTOLIC) 5 MG tablet Take 5 mg by mouth daily.    [provider]  pravastatin (PRAVACHOL) 10 MG tablet Take 10 mg by mouth at bedtime.    [provider]  telmisartan (MICARDIS) 40 MG tablet Take 40 mg by mouth daily. 06/16/19   [provider]    Physical Exam: Vitals:   07/18/22 0149 07/18/22 0500 07/18/22 0655 07/18/22 0659  BP:  135/82 (!) 146/84   Pulse:  95 91   Resp: 19 18 18    Temp: 97.7 F (36.5 C)   97.6 F (36.4 C)  TempSrc: Oral   Oral  SpO2:  94% 100%    Physical Exam Vitals and nursing note reviewed.  Constitutional:      Appearance: Normal appearance.  HENT:     Head: Normocephalic.     Mouth/Throat:     Mouth: Mucous membranes are moist.  Eyes:     General: No scleral icterus.    Pupils: Pupils are equal, round, and reactive to light.  Cardiovascular:     Rate and Rhythm: Normal rate and regular rhythm.  Pulmonary:     Effort: Pulmonary effort is normal.  Abdominal:     General: Bowel sounds are normal. There is no distension.     Palpations: Abdomen is soft.     Tenderness: There is no abdominal tenderness. There is no guarding.  Musculoskeletal:     Cervical back: Neck supple.     Right lower leg: No edema.     Left lower leg: No edema.  Skin:    General: Skin is warm and dry.  Neurological:     General: No focal deficit present.     Mental Status: She is alert and oriented to person, place, and time.  Psychiatric:        Mood and Affect: Mood normal.        Behavior:  Behavior normal.   Data Reviewed:  Results are pending, will review when available.  Assessment and Plan: Principal Problem:   Vertiginous syndrome Observation/telemetry. Continue IV hydration. Continue meclizine 25 mg p.o. 3 times daily as needed. Continue antiemetics as needed. Consult physical therapy in the morning.  Active Problems:   Type 2 diabetes mellitus with hyperglycemia (HCC) Continue IV fluids. Carbohydrate modified diet. Continue Jardiance 25 mg p.o. daily. CBG monitoring before meals and bedtime. RI SS while in the hospital.    Hypokalemia Replacing. Magnesium was supplemented. Follow-up potassium level in AM.    Hyperlipidemia  Continue atorvastatin 20 mg p.o. daily.    GERD (gastroesophageal reflux disease) Pantoprazole 40 mg IVP x 1. Pantoprazole 40 mg p.o. beginning daily tomorrow.     Advance Care Planning:   Code Status: Full Code   Consults:   Family Communication:   Severity of Illness: The appropriate patient status for this patient is OBSERVATION. Observation status is judged to be reasonable and necessary in order to provide the required intensity of service to ensure the patient's safety. The patient's presenting symptoms, physical exam findings, and initial radiographic and laboratory data in the context of their medical condition is felt to place them at decreased risk for further clinical deterioration. Furthermore, it is anticipated that the patient will be medically stable for discharge from the hospital within 2 midnights of admission.   Author: Reubin Milan, MD 07/18/2022 8:50 AM  For on call review [email or web address].   This document was prepared using Dragon voice recognition software and may contain some unintended transcription errors.

## 2022-07-18 NOTE — ED Triage Notes (Signed)
Pt woke about 0030 with sudden onset dizziness and vomiting.  Worse with any change in position or movement.  Pt was given 4 mg zofran and a liter of LR infusing by EMS.  Pt actively vomiting when moved over to stretcher. No reported hx of vertigo.  No CP, shob.

## 2022-07-18 NOTE — ED Notes (Signed)
ED TO INPATIENT HANDOFF REPORT  Name/Age/Gender Carrie Woodward 67 y.o. female  Code Status    Code Status Orders  (From admission, onward)           Start     Ordered   07/18/22 0852  Full code  Continuous       Question:  By:  Answer:  Consent: discussion documented in EHR   07/18/22 0852           Code Status History     This patient has a current code status but no historical code status.       Home/SNF/Other Home  Chief Complaint Vertiginous syndrome [H81.90]  Level of Care/Admitting Diagnosis ED Disposition     ED Disposition  Admit   Condition  --   Comment  Hospital Area: [facility] [[id number]]  Level of Care: Telemetry [5]  Admit to tele based on following criteria: Monitor QTC interval  May place patient in observation at [facility] or [facility] if equivalent level of care is available:: No  Covid Evaluation: Asymptomatic - no recent exposure (last 10 days) testing not required  Diagnosis: Vertiginous syndrome [id number]  Admitting Physician: Reubin Milan [id number]  Attending Physician: Reubin Milan [id number]          Medical History Past Medical History:  Diagnosis Date   Allergy    Arthritis    arms, hands   Diabetes mellitus 2011   oral agents   GERD (gastroesophageal reflux disease)    on meds   Headache(784.0)    Hypertension     Allergies No Known Allergies  IV Location/Drains/Wounds Patient Lines/Drains/Airways Status     Active Line/Drains/Airways     Name Placement date Placement time Site Days   Peripheral IV 07/18/22 20 G 1" Left Antecubital 07/18/22  0200  Antecubital  less than 1   External Urinary Catheter 07/18/22  0245  --  less than 1   Incision 11/13/10 Perineum Other (Comment) 11/13/10  1210  -- 4265   Incision (Closed) 10/04/20 Breast Right 10/04/20  1355  -- 652            Labs/Imaging Results for orders placed or performed during the hospital encounter of  07/18/22 (from the past 48 hour(s))  Comprehensive metabolic panel     Status: Abnormal   Collection Time: 07/18/22  2:35 AM  Result Value Ref Range   Sodium 137 135 - 145 mmol/L   Potassium 2.6 (LL) 3.5 - 5.1 mmol/L    Comment: CRITICAL RESULT CALLED TO, READ BACK BY AND VERIFIED WITH Chauncey Cruel MCDONALD, RN 07/18/22 0328 J. COLE   Chloride 102 98 - 111 mmol/L   CO2 23 22 - 32 mmol/L   Glucose, Bld 255 (H) 70 - 99 mg/dL    Comment: Glucose reference range applies only to samples taken after fasting for at least 8 hours.   BUN 17 8 - 23 mg/dL   Creatinine, Ser 0.72 0.44 - 1.00 mg/dL   Calcium 8.9 8.9 - 10.3 mg/dL   Total Protein 7.5 6.5 - 8.1 g/dL   Albumin 4.0 3.5 - 5.0 g/dL   AST 25 15 - 41 U/L   ALT 32 0 - 44 U/L   Alkaline Phosphatase 90 38 - 126 U/L   Total Bilirubin 0.7 0.3 - 1.2 mg/dL   GFR, Estimated >60 >60 mL/min    Comment: (NOTE) Calculated using the CKD-EPI Creatinine Equation (2021)    Anion gap 12 5 -  15    Comment: Performed at [facility], [place] [address], [place], [place] [address]  Lipase, blood     Status: None   Collection Time: 07/18/22  2:35 AM  Result Value Ref Range   Lipase 33 11 - 51 U/L    Comment: Performed at [facility], [place] [address] [place], [place] [address]  CBC with Diff     Status: None   Collection Time: 07/18/22  2:35 AM  Result Value Ref Range   WBC 10.3 4.0 - 10.5 K/uL   RBC 4.39 3.87 - 5.11 MIL/uL   Hemoglobin 12.5 12.0 - 15.0 g/dL   HCT 37.6 36.0 - 46.0 %   MCV 85.6 80.0 - 100.0 fL   MCH 28.5 26.0 - 34.0 pg   MCHC 33.2 30.0 - 36.0 g/dL   RDW 13.5 11.5 - 15.5 %   Platelets 262 150 - 400 K/uL   nRBC 0.0 0.0 - 0.2 %   Neutrophils Relative % 70 %   Neutro Abs 7.2 1.7 - 7.7 K/uL   Lymphocytes Relative 22 %   Lymphs Abs 2.3 0.7 - 4.0 K/uL   Monocytes Relative 6 %   Monocytes Absolute 0.7 0.1 - 1.0 K/uL   Eosinophils Relative 1 %   Eosinophils Absolute 0.1 0.0 - 0.5 K/uL   Basophils Relative 0 %    Basophils Absolute 0.0 0.0 - 0.1 K/uL   Immature Granulocytes 1 %   Abs Immature Granulocytes 0.07 0.00 - 0.07 K/uL    Comment: Performed at [facility], [place] [address] [place], [place] [address]  Beta-hydroxybutyric acid     Status: None   Collection Time: 07/18/22  2:36 AM  Result Value Ref Range   Beta-Hydroxybutyric Acid 0.24 0.05 - 0.27 mmol/L    Comment: Performed at [facility], [place] [address] [place], [place] [address]  Blood gas, venous (at [facility] and [facility])     Status: Abnormal   Collection Time: 07/18/22  3:25 AM  Result Value Ref Range   pH, Ven 7.32 7.25 - 7.43   pCO2, Ven 49 44 - 60 mmHg   pO2, Ven 49 (H) 32 - 45 mmHg   Bicarbonate 25.2 20.0 - 28.0 mmol/L   Acid-base deficit 1.4 0.0 - 2.0 mmol/L   O2 Saturation 83.3 %   Patient temperature 37.0     Comment: Performed at [facility], [place] [address], [place], [place] [address]  Urinalysis, Routine w reflex microscopic -Urine, Clean Catch     Status: Abnormal   Collection Time: 07/18/22  3:26 AM  Result Value Ref Range   Color, Urine COLORLESS (A) YELLOW   APPearance CLEAR CLEAR   Specific Gravity, Urine 1.016 1.005 - 1.030   pH 7.0 5.0 - 8.0   Glucose, UA >=500 (A) NEGATIVE mg/dL   Hgb urine dipstick NEGATIVE NEGATIVE   Bilirubin Urine NEGATIVE NEGATIVE   Ketones, ur 5 (A) NEGATIVE mg/dL   Protein, ur NEGATIVE NEGATIVE mg/dL   Nitrite NEGATIVE NEGATIVE   Leukocytes,Ua NEGATIVE NEGATIVE   RBC / HPF 0-5 0 - 5 RBC/hpf   WBC, UA 0-5 0 - 5 WBC/hpf   Bacteria, UA NONE SEEN NONE SEEN   Squamous Epithelial / HPF 0-5 0 - 5 /HPF    Comment: Performed at [facility], [place] [address] [place], [place] [address]  POC CBG, ED     Status: Abnormal   Collection Time: 07/18/22  3:32  AM  Result Value Ref Range   Glucose-Capillary 226 (H) 70 - 99 mg/dL    Comment: Glucose reference range applies only to samples taken after fasting for at least 8 hours.    MR BRAIN WO CONTRAST  Result Date: 07/18/2022 CLINICAL DATA:  67 year old female with dizziness, ataxia, vomiting since 0300 hours. EXAM: MRI HEAD WITHOUT CONTRAST TECHNIQUE: Multiplanar, multiecho pulse sequences of the brain and surrounding structures were obtained without intravenous contrast. COMPARISON:  Plain head CT 0353 hours today. FINDINGS: Brain: No restricted diffusion to suggest acute infarction. No midline shift, mass effect, evidence of mass lesion, ventriculomegaly, extra-axial collection or acute intracranial hemorrhage. Cervicomedullary junction and pituitary are within normal limits. Cavum septum pellucidum, normal variant. Intermittent mild motion artifact despite repeated imaging attempts (FLAIR series 9). Overall normal for age gray and white matter signal. No cortical encephalomalacia or chronic cerebral blood products identified. Deep gray matter nuclei, brainstem and cerebellum appear negative. Vascular: Major intracranial vascular flow voids are preserved. Skull and upper cervical spine: Negative visible cervical spine. Visualized bone marrow signal is within normal limits. Sinuses/Orbits: Negative. Other: Grossly normal visible internal auditory structures. Mastoids are clear. Normal stylomastoid foramina. Negative visible scalp and face. IMPRESSION: No acute intracranial abnormality. Slightly motion degraded but normal for age noncontrast MRI appearance of the Brain. Electronically Signed   By: Genevie Ann M.D.   On: 07/18/2022 06:27   CT Head Wo Contrast  Result Date: 07/18/2022 CLINICAL DATA:  67 year old female with dizziness, ataxia, vomiting since 0300 hours. EXAM: CT HEAD WITHOUT CONTRAST TECHNIQUE: Contiguous axial images were obtained from the base of the skull through the vertex without intravenous contrast. RADIATION DOSE REDUCTION: This exam was performed according to the departmental dose-optimization program which includes automated exposure control, adjustment of the  mA and/or kV according to patient size and/or use of iterative reconstruction technique. COMPARISON:  Cervical spine CT 02/11/2008. FINDINGS: Brain: Cavum septum pellucidum, normal variant. Cerebral volume is within normal limits for age. No midline shift, ventriculomegaly, mass effect, evidence of mass lesion, intracranial hemorrhage or evidence of cortically based acute infarction. Gray-white matter differentiation is within normal limits throughout the brain. No encephalomalacia identified. Vascular: Calcified atherosclerosis at the skull base. No suspicious intracranial vascular hyperdensity. Skull: Negative.  Bone mineralization is within normal limits. Sinuses/Orbits: Tympanic cavities, visualized paranasal sinuses and mastoids are clear. Other: Visualized orbits and scalp soft tissues are within normal limits. IMPRESSION: Normal for age noncontrast Head CT. Electronically Signed   By: Genevie Ann M.D.   On: 07/18/2022 04:06    Pending Labs Unresulted Labs (From admission, onward)     Start     Ordered   07/25/22 0500  Creatinine, serum  (enoxaparin (LOVENOX)    CrCl >/= 30 ml/min)  Weekly,   R     Comments: while on enoxaparin therapy    07/18/22 0852   07/19/22 0500  HIV Antibody (routine testing w rflx)  (HIV Antibody (Routine testing w reflex) panel)  Tomorrow morning,   R        07/18/22 0852   07/19/22 0500  CBC  Tomorrow morning,   R        07/18/22 0852   07/19/22 0500  Comprehensive metabolic panel  Tomorrow morning,   R        07/18/22 0852   07/18/22 0853  Hemoglobin A1c  Add-on,   AD       Comments: To assess prior glycemic control    07/18/22 0853  Signed and Held  Magnesium  Add-on,   R        Signed and Held   Signed and Held  Phosphorus  Add-on,   R        Signed and Held            Vitals/Pain Today's Vitals   07/18/22 0149 07/18/22 0500 07/18/22 0655 07/18/22 0659  BP:  135/82 (!) 146/84   Pulse:  95 91   Resp: 19 18 18    Temp: 97.7 F (36.5 C)   97.6 F  (36.4 C)  TempSrc: Oral   Oral  SpO2:  94% 100%   PainSc:        Isolation Precautions No active isolations  Medications Medications  magnesium sulfate IVPB 2 g 50 mL (has no administration in time range)  0.9 % NaCl with KCl 40 mEq / L  infusion (has no administration in time range)  meclizine (ANTIVERT) tablet 25 mg (has no administration in time range)  enoxaparin (LOVENOX) injection 40 mg (has no administration in time range)  acetaminophen (TYLENOL) tablet 650 mg (has no administration in time range)    Or  acetaminophen (TYLENOL) suppository 650 mg (has no administration in time range)  ondansetron (ZOFRAN) tablet 4 mg (has no administration in time range)    Or  ondansetron (ZOFRAN) injection 4 mg (has no administration in time range)  insulin aspart (novoLOG) injection 0-9 Units (has no administration in time range)  sodium chloride 0.9 % bolus 1,000 mL (0 mLs Intravenous Stopped 07/18/22 0334)  prochlorperazine (COMPAZINE) injection 10 mg (10 mg Intravenous Given 07/18/22 0242)  LORazepam (ATIVAN) injection 0.5 mg (0.5 mg Intravenous Given 07/18/22 0241)  potassium chloride 10 mEq in 100 mL IVPB (0 mEq Intravenous Stopped 07/18/22 0812)  sodium chloride 0.9 % bolus 250 mL (0 mLs Intravenous Stopped 07/18/22 0536)  meclizine (ANTIVERT) tablet 25 mg (25 mg Oral Given 07/18/22 0709)  potassium chloride SA (KLOR-CON M) CR tablet 40 mEq (40 mEq Oral Given 07/18/22 0812)  diazepam (VALIUM) injection 2.5 mg (2.5 mg Intravenous Given 07/18/22 0849)    Mobility walks with person assist

## 2022-07-18 NOTE — ED Notes (Signed)
No verbal/telephone report received. 

## 2022-07-18 NOTE — ED Notes (Signed)
Patient transported to MRI 

## 2022-07-18 NOTE — ED Provider Notes (Addendum)
Patient here with dizziness.  She has had unremarkable imaging including MRI.  She has had potassium repletion.  She has had multiple medicines to help with vertigo type symptoms.  When trying to ambulate her patient still extremely symptomatic.  Unable to get very far without needing assistance.  Overall we will admit for further symptomatic care.  Overall suspect that this is vertigo.  Will give her a dose of Valium and admit to medicine for further support.   Lennice Sites, DO 07/18/22 [place], [place], DO 07/18/22 [phone]

## 2022-07-18 NOTE — ED Provider Notes (Signed)
[facility] Provider Note  CSN: [id number] Arrival date & time: 07/18/22 0141  Chief Complaint(s) Emesis and Dizziness  HPI Carrie Woodward is a 67 y.o. female with a past medical history listed below including hypertension, diabetes who presents to the emergency department with vertiginous symptoms described as room spinning.  Sudden onset around midnight while lying in bed.  Worse with changing positions, movement and moving her head.  She reported several bouts of nonbloody nonbilious emesis.  She denies any recent fevers or infections.  No coughing or congestion.  Denies any headache, focal deficits.  No prior history of vertiginous symptoms in the past.  The history is provided by the patient.    Past Medical History Past Medical History:  Diagnosis Date   Allergy    Arthritis    arms, hands   Diabetes mellitus 2011   oral agents   GERD (gastroesophageal reflux disease)    on meds   Headache(784.0)    Hypertension    There are no problems to display for this patient.  Home Medication(s) Prior to Admission medications   Medication Sig Start Date End Date Taking? Authorizing Provider  acetaminophen (TYLENOL) 500 MG tablet Take 500 mg by mouth every 8 (eight) hours as needed for mild pain.    [provider]  aspirin 81 MG tablet Take 81 mg by mouth daily with breakfast.    [provider]  benzonatate (TESSALON) 100 MG capsule Take 1-2 capsules (100-200 mg total) by mouth 3 (three) times daily as needed for cough. 12/12/14   Adams-Doolittle, Marland Kitchen, MD  Chlorpheniramine-DM (CORICIDIN HBP COUGH/COLD PO) Take 1 tablet by mouth daily as needed (cold/sinus symptoms).    [provider]  Empagliflozin-linaGLIPtin (GLYXAMBI) 25-5 MG TABS Take by mouth.    [provider]  hydrochlorothiazide (HYDRODIURIL) 12.5 MG tablet Take 12.5 mg by mouth daily. 06/15/19   [provider]   HYDROcodone-homatropine (HYCODAN) 5-1.5 MG/5ML syrup Take 5 mLs by mouth every 8 (eight) hours as needed for cough. 12/12/14   Adams-Doolittle, Marland Kitchen, MD  ibuprofen (ADVIL,MOTRIN) 200 MG tablet Take 400 mg by mouth every 6 (six) hours as needed (pain).    [provider]  loratadine (CLARITIN) 10 MG tablet Take 10 mg by mouth daily.    [provider]  nebivolol (BYSTOLIC) 5 MG tablet Take 5 mg by mouth daily.    [provider]  pravastatin (PRAVACHOL) 10 MG tablet Take 10 mg by mouth at bedtime.    [provider]  telmisartan (MICARDIS) 40 MG tablet Take 40 mg by mouth daily. 06/16/19   [provider]                                                                                                                                    Allergies Patient has no known allergies.  Review of Systems Review of Systems As  noted in HPI  Physical Exam Vital Signs  I have reviewed the triage vital signs BP (!) 165/73   Pulse 92   Temp 97.7 F (36.5 C) (Oral)   Resp 19   LMP  (LMP Unknown)   SpO2 100%   Physical Exam Vitals reviewed.  Constitutional:      General: She is not in acute distress.    Appearance: She is well-developed. She is not diaphoretic.  HENT:     Head: Normocephalic and atraumatic.     Nose: Nose normal.  Eyes:     General: No scleral icterus.       Right eye: No discharge.        Left eye: No discharge.     Conjunctiva/sclera: Conjunctivae normal.     Pupils: Pupils are equal, round, and reactive to light.  Cardiovascular:     Rate and Rhythm: Normal rate and regular rhythm.     Heart sounds: No murmur heard.    No friction rub. No gallop.  Pulmonary:     Effort: Pulmonary effort is normal. No respiratory distress.     Breath sounds: Normal breath sounds. No stridor. No rales.  Abdominal:     General: There is no distension.     Palpations: Abdomen is soft.     Tenderness: There is no abdominal tenderness.   Musculoskeletal:        General: No tenderness.     Cervical back: Normal range of motion and neck supple.  Skin:    General: Skin is warm and dry.     Findings: No erythema or rash.  Neurological:     Mental Status: She is alert and oriented to person, place, and time.     Comments: Difficult to get neuro exam do to generalized weakness. When patient has an episode of emesis, she moves all extremities briskly and with good strength. Sensation intact. EOMI with unilateral nystagnus (fast beat to left) No clonus or hyperreflexia.  Unable to perform HINTS      ED Results and Treatments Labs (all labs ordered are listed, but only abnormal results are displayed) Labs Reviewed  COMPREHENSIVE METABOLIC PANEL - Abnormal; Notable for the following components:      Result Value   Potassium 2.6 (*)    Glucose, Bld 255 (*)    All other components within normal limits  URINALYSIS, ROUTINE W REFLEX MICROSCOPIC - Abnormal; Notable for the following components:   Color, Urine COLORLESS (*)    Glucose, UA >=500 (*)    Ketones, ur 5 (*)    All other components within normal limits  BLOOD GAS, VENOUS - Abnormal; Notable for the following components:   pO2, Ven 49 (*)    All other components within normal limits  CBG MONITORING, ED - Abnormal; Notable for the following components:   Glucose-Capillary 226 (*)    All other components within normal limits  LIPASE, BLOOD  CBC WITH DIFFERENTIAL/PLATELET  BETA-HYDROXYBUTYRIC ACID  EKG  EKG Interpretation  Date/Time:    Ventricular Rate:    PR Interval:    QRS Duration:   QT Interval:    QTC Calculation:   R Axis:     Text Interpretation:         Radiology MR BRAIN WO CONTRAST  Result Date: 07/18/2022 CLINICAL DATA:  67 year old female with dizziness, ataxia, vomiting since 0300 hours. EXAM: MRI HEAD WITHOUT  CONTRAST TECHNIQUE: Multiplanar, multiecho pulse sequences of the brain and surrounding structures were obtained without intravenous contrast. COMPARISON:  Plain head CT 0353 hours today. FINDINGS: Brain: No restricted diffusion to suggest acute infarction. No midline shift, mass effect, evidence of mass lesion, ventriculomegaly, extra-axial collection or acute intracranial hemorrhage. Cervicomedullary junction and pituitary are within normal limits. Cavum septum pellucidum, normal variant. Intermittent mild motion artifact despite repeated imaging attempts (FLAIR series 9). Overall normal for age gray and white matter signal. No cortical encephalomalacia or chronic cerebral blood products identified. Deep gray matter nuclei, brainstem and cerebellum appear negative. Vascular: Major intracranial vascular flow voids are preserved. Skull and upper cervical spine: Negative visible cervical spine. Visualized bone marrow signal is within normal limits. Sinuses/Orbits: Negative. Other: Grossly normal visible internal auditory structures. Mastoids are clear. Normal stylomastoid foramina. Negative visible scalp and face. IMPRESSION: No acute intracranial abnormality. Slightly motion degraded but normal for age noncontrast MRI appearance of the Brain. Electronically Signed   By: Genevie Ann M.D.   On: 07/18/2022 06:27   CT Head Wo Contrast  Result Date: 07/18/2022 CLINICAL DATA:  67 year old female with dizziness, ataxia, vomiting since 0300 hours. EXAM: CT HEAD WITHOUT CONTRAST TECHNIQUE: Contiguous axial images were obtained from the base of the skull through the vertex without intravenous contrast. RADIATION DOSE REDUCTION: This exam was performed according to the departmental dose-optimization program which includes automated exposure control, adjustment of the mA and/or kV according to patient size and/or use of iterative reconstruction technique. COMPARISON:  Cervical spine CT 02/11/2008. FINDINGS: Brain: Cavum septum  pellucidum, normal variant. Cerebral volume is within normal limits for age. No midline shift, ventriculomegaly, mass effect, evidence of mass lesion, intracranial hemorrhage or evidence of cortically based acute infarction. Gray-white matter differentiation is within normal limits throughout the brain. No encephalomalacia identified. Vascular: Calcified atherosclerosis at the skull base. No suspicious intracranial vascular hyperdensity. Skull: Negative.  Bone mineralization is within normal limits. Sinuses/Orbits: Tympanic cavities, visualized paranasal sinuses and mastoids are clear. Other: Visualized orbits and scalp soft tissues are within normal limits. IMPRESSION: Normal for age noncontrast Head CT. Electronically Signed   By: Genevie Ann M.D.   On: 07/18/2022 04:06    Medications Ordered in ED Medications  sodium chloride 0.9 % bolus 1,000 mL (0 mLs Intravenous Stopped 07/18/22 0334)    And  0.9 %  sodium chloride infusion ( Intravenous New Bag/Given 07/18/22 0334)  potassium chloride 10 mEq in 100 mL IVPB (10 mEq Intravenous New Bag/Given 07/18/22 0521)  prochlorperazine (COMPAZINE) injection 10 mg (10 mg Intravenous Given 07/18/22 0242)  LORazepam (ATIVAN) injection 0.5 mg (0.5 mg Intravenous Given 07/18/22 0241)  sodium chloride 0.9 % bolus 250 mL (0 mLs Intravenous Stopped 07/18/22 0536)  Procedures Procedures  (including critical care time)  Medical Decision Making / ED Course  Click here for ABCD2, HEART and other calculators  Medical Decision Making Amount and/or Complexity of Data Reviewed Labs: ordered. Decision-making details documented in ED Course. Radiology: ordered and independent interpretation performed. Decision-making details documented in ED Course.  Risk Prescription drug management. Decision regarding hospitalization.    This patient  presents to the ED for concern of dizziness, this involves an extensive number of treatment options, and is a complaint that carries with it a high risk of complications and morbidity. The differential diagnosis includes but not limited to peripheral vertigo vs central process. Exam favors peripheral cause, but given age will get CT head to rule out mass or bleed. May get MRI if there no improvement.  Will rule out electrolyte or metabolic derangements including DKA.  Will treat with antiemetics, IVF and ativan.  Work up: CBC without leukocytosis or anemia Metabolic panel with hypokalemia at 2.6.  No other significant electrolyte derangements.  Hyperglycemia without DKA.  No renal insufficiency.  No evidence of bili obstruction or pancreatitis CT head negative for ICH or mass effect.  On reassessment: Patient still having vertiginous symptoms Unable to get a good neuro exam  5:28 AM On reassessment, patient reports feeling better. Exacerbated symptoms with movement. Will continue to hydrate and replete K+.  Sent for MRI to rule out stroke.  7:14 AM MRI negative for stroke. Patient still needs to complete repletion. Still symptomatic. Will given antivert and reassess. If stable ambulating, he had be DC'd. If unstable, she may need admission for vestibular rehab.  Patient care turned over to oncoming provider. Patient case and results discussed in detail; please see their note for further ED managment.       Final Clinical Impression(s) / ED Diagnoses Final diagnoses:  Dizziness           This chart was dictated using voice recognition software.  Despite best efforts to proofread,  errors can occur which can change the documentation meaning.    Fatima Blank, MD 07/18/22 [phone]

## 2022-07-19 LAB — CBC
HCT: 39.7 % (ref 36.0–46.0)
Hemoglobin: 12.6 g/dL (ref 12.0–15.0)
MCH: 27.8 pg (ref 26.0–34.0)
MCHC: 31.7 g/dL (ref 30.0–36.0)
MCV: 87.4 fL (ref 80.0–100.0)
Platelets: 250 10*3/uL (ref 150–400)
RBC: 4.54 MIL/uL (ref 3.87–5.11)
RDW: 14.2 % (ref 11.5–15.5)
WBC: 4.8 10*3/uL (ref 4.0–10.5)
nRBC: 0 % (ref 0.0–0.2)

## 2022-07-19 LAB — HEMOGLOBIN A1C
Hgb A1c MFr Bld: 8.6 % — ABNORMAL HIGH (ref 4.8–5.6)
Mean Plasma Glucose: 200 mg/dL

## 2022-07-19 LAB — GLUCOSE, CAPILLARY
Glucose-Capillary: 140 mg/dL — ABNORMAL HIGH (ref 70–99)
Glucose-Capillary: 136 mg/dL — ABNORMAL HIGH (ref 70–99)

## 2022-07-19 LAB — COMPREHENSIVE METABOLIC PANEL
ALT: 27 U/L (ref 0–44)
AST: 19 U/L (ref 15–41)
Albumin: 3.6 g/dL (ref 3.5–5.0)
Alkaline Phosphatase: 79 U/L (ref 38–126)
Anion gap: 7 (ref 5–15)
BUN: 11 mg/dL (ref 8–23)
CO2: 23 mmol/L (ref 22–32)
Calcium: 8.5 mg/dL — ABNORMAL LOW (ref 8.9–10.3)
Chloride: 108 mmol/L (ref 98–111)
Creatinine, Ser: 0.68 mg/dL (ref 0.44–1.00)
GFR, Estimated: >60 mL/min (ref >60)
Glucose, Bld: 158 mg/dL — ABNORMAL HIGH (ref 70–99)
Potassium: 3.9 mmol/L (ref 3.5–5.1)
Sodium: 138 mmol/L (ref 135–145)
Total Bilirubin: 0.7 mg/dL (ref 0.3–1.2)
Total Protein: 6.9 g/dL (ref 6.5–8.1)

## 2022-07-19 LAB — HIV ANTIBODY (ROUTINE TESTING W REFLEX): HIV Screen 4th Generation wRfx: NONREACTIVE

## 2022-07-19 MED ORDER — MECLIZINE HCL 25 MG PO TABS: 25.0000 mg | ORAL_TABLET | Freq: Three times a day (TID) | ORAL | 0 refills | Status: AC | PRN

## 2022-07-19 MED ORDER — PANTOPRAZOLE SODIUM 40 MG PO TBEC: 40.0000 mg | DELAYED_RELEASE_TABLET | Freq: Every day | ORAL | 0 refills | Status: DC

## 2022-07-19 NOTE — TOC Transition Note (Addendum)
Transition of Care [facility]) - CM/SW Discharge Note   Patient Details  Name: Carrie Woodward MRN: [id number] Date of Birth: [birth year]  [facility] CM/SW Contact:  Vassie Moselle, LCSW Phone Number: 07/19/2022, 12:23 PM   Clinical Narrative:    Pt agreeable for RW to be ordered. Pt has no insurance and unable to pay for RW. Medically indigent RW has been ordered through [employer] and will be delivered to pt's room prior to discharge. No further TOC needs identified.    Final next level of care: Home/Self Care Barriers to Discharge: No Barriers Identified   Patient Goals and CMS Choice CMS Medicare.gov Compare Post Acute Care list provided to:: Patient Choice offered to / list presented to : Patient  Discharge Placement                         Discharge Plan and Services Additional resources added to the After Visit Summary for                  DME Arranged: Walker rolling DME Agency: [employer] Date DME Agency Contacted: 07/19/22 Time DME Agency Contacted: 1222 Representative spoke with at DME Agency: [place] ([place]) Interventions SDOH Screenings   Food Insecurity: No Food Insecurity (07/18/2022)  Housing: Low Risk  (07/18/2022)  Transportation Needs: No Transportation Needs (07/18/2022)  Utilities: Not At Risk (07/18/2022)  Tobacco Use: Low Risk  (07/18/2022)     Readmission Risk Interventions     No data to display

## 2022-07-19 NOTE — Progress Notes (Signed)
  Transition of Care [facility]) Screening Note   Patient Details  Name: SUJEY SAXER Date of Birth: [birth year]   Transition of Care [facility]) CM/SW Contact:    Vassie Moselle, LCSW Phone Number: 07/19/2022, 11:51 AM    Transition of Care Department [facility]) has reviewed patient and no TOC needs have been identified at this time. We will continue to monitor patient advancement through interdisciplinary progression rounds. If new patient transition needs arise, please place a TOC consult.

## 2022-07-19 NOTE — Discharge Summary (Signed)
Physician Discharge Summary  [name] [id number] DOB: March 23, 1956 DOA: 07/18/2022  PCP: Iona Beard, MD  Admit date: 07/18/2022 Discharge date: 07/19/2022  Admitted From: Home Disposition:  Home  Discharge Condition:Stable CODE STATUS:FULL Diet recommendation: Heart Healthy   Brief/Interim Summary:  Carrie Woodward is a 67 y.o. female with medical history significant of seasonal allergies, osteoarthritis, type 2 diabetes, GERD, headaches, hypertension who presented to the emergency department with sudden onset of dizziness and emesis that worsened with movement or changing position.  She received 4 mg of Zofran and 1 L of LR with EMS.  Even with this, she was still vomiting when she came to the emergency department .  No previous history like this, no history of recent viral illness.  She feels the room is spinning around her.  On presentation she was hemodynamically stable.  She was admitted for the management of intractable vertigo.  MRI of the brain was done and it was negative for any findings.  Patient seen by PT, no follow-up recommended.  She felt much better today with minimal dizziness or lightheadedness.  She could have BPPV.  We recommend to follow-up with ENT as an outpatient, prescribed as needed meclizine.  Medically stable for discharge home today  Following problems were addressed during the hospitalization:  Vertigo: History as above.  Denies significant dizziness or lightheadedness today.  PT evaluated the patient.  Ambulated well.  No follow-up recommended.  Orthostatic vitals negative.  Continue meclizine as needed at home.  Will recommend to follow-up with ENT as an outpatient.  Most likely it was BPPV  Type 2 diabetes: Currently blood sugars stable.  Continue home medications  Hypertension: Continue home medication , blood pressure stable  Hypokalemia: Supplemented and corrected  Hyperlipidemia: Continue Lipitor  GERD: Started on Protonix  Discharge  Diagnoses:  Principal Problem:   Vertiginous syndrome Active Problems:   Type 2 diabetes mellitus with hyperglycemia (HCC)   Hypokalemia   Hyperlipidemia   GERD (gastroesophageal reflux disease)    Discharge Instructions  Discharge Instructions     Diet - low sodium heart healthy   Complete by: As directed    Discharge instructions   Complete by: As directed    1)Please take prescribed medications as instructed 2)Follow up with your PCP in a week 3)Follow up with ENT in 1 to 2 weeks.  Name and number of  the provider has been attached   Increase activity slowly   Complete by: As directed       Allergies as of 07/19/2022   No Known Allergies      Medication List     STOP taking these medications    HYDROcodone-homatropine 5-1.5 MG/5ML syrup Commonly known as: HYCODAN   Jardiance 25 MG Tabs tablet Generic drug: empagliflozin       TAKE these medications    acetaminophen 500 MG tablet Commonly known as: TYLENOL Take 500 mg by mouth every 8 (eight) hours as needed for mild pain.   aspirin 81 MG tablet Take 81 mg by mouth daily with breakfast.   atorvastatin 20 MG tablet Commonly known as: LIPITOR Take 20 mg by mouth daily.   benzonatate 100 MG capsule Commonly known as: TESSALON Take 1-2 capsules (100-200 mg total) by mouth 3 (three) times daily as needed for cough.   CORICIDIN HBP COUGH/COLD PO Take 1 tablet by mouth daily as needed (cold/sinus symptoms).   Glyxambi 25-5 MG Tabs Generic drug: Empagliflozin-linaGLIPtin Take by mouth.   hydrochlorothiazide 12.5 MG tablet  Commonly known as: HYDRODIURIL Take 12.5 mg by mouth daily.   loratadine 10 MG tablet Commonly known as: CLARITIN Take 10 mg by mouth daily.   meclizine 25 MG tablet Commonly known as: ANTIVERT Take 1 tablet (25 mg total) by mouth 3 (three) times daily as needed for dizziness.   pantoprazole 40 MG tablet Commonly known as: PROTONIX Take 1 tablet (40 mg total) by mouth  daily for 14 days. Start taking on: July 20, 2022   telmisartan 40 MG tablet Commonly known as: MICARDIS Take 40 mg by mouth daily.               Durable Medical Equipment  (From admission, onward)           Start     Ordered   07/19/22 1141  For home use only DME Walker rolling  Once       Question Answer Comment  Walker: With 5 Inch Wheels   Patient needs a walker to treat with the following condition Balance disorder      07/19/22 1140   07/19/22 1135  For home use only DME Walker rolling  Once       Question Answer Comment  Walker: With [place]   Patient needs a walker to treat with the following condition Difficulty walking      07/19/22 1134            Follow-up Information     Izora Gala, MD. Schedule an appointment as soon as possible for a visit in 1 week(s).   Specialty: Otolaryngology Why: FOLLOW UP WITH EAR NOSE AND THROAT DOCTOR Contact information: [address] [place] [address] [phone]         Iona Beard, MD. Schedule an appointment as soon as possible for a visit in 1 week(s).   Specialty: Family Medicine Contact information: [address] [place] [address] [phone]                No Known Allergies  Consultations: None   Procedures/Studies: MR BRAIN WO CONTRAST  Result Date: 07/18/2022 CLINICAL DATA:  67 year old female with dizziness, ataxia, vomiting since 0300 hours. EXAM: MRI HEAD WITHOUT CONTRAST TECHNIQUE: Multiplanar, multiecho pulse sequences of the brain and surrounding structures were obtained without intravenous contrast. COMPARISON:  Plain head CT 0353 hours today. FINDINGS: Brain: No restricted diffusion to suggest acute infarction. No midline shift, mass effect, evidence of mass lesion, ventriculomegaly, extra-axial collection or acute intracranial hemorrhage. Cervicomedullary junction and pituitary are within normal limits. Cavum septum pellucidum, normal  variant. Intermittent mild motion artifact despite repeated imaging attempts (FLAIR series 9). Overall normal for age gray and white matter signal. No cortical encephalomalacia or chronic cerebral blood products identified. Deep gray matter nuclei, brainstem and cerebellum appear negative. Vascular: Major intracranial vascular flow voids are preserved. Skull and upper cervical spine: Negative visible cervical spine. Visualized bone marrow signal is within normal limits. Sinuses/Orbits: Negative. Other: Grossly normal visible internal auditory structures. Mastoids are clear. Normal stylomastoid foramina. Negative visible scalp and face. IMPRESSION: No acute intracranial abnormality. Slightly motion degraded but normal for age noncontrast MRI appearance of the Brain. Electronically Signed   By: Genevie Ann M.D.   On: 07/18/2022 06:27   CT Head Wo Contrast  Result Date: 07/18/2022 CLINICAL DATA:  67 year old female with dizziness, ataxia, vomiting since 0300 hours. EXAM: CT HEAD WITHOUT CONTRAST TECHNIQUE: Contiguous axial images were obtained from the base of the skull through  the vertex without intravenous contrast. RADIATION DOSE REDUCTION: This exam was performed according to the departmental dose-optimization program which includes automated exposure control, adjustment of the mA and/or kV according to patient size and/or use of iterative reconstruction technique. COMPARISON:  Cervical spine CT 02/11/2008. FINDINGS: Brain: Cavum septum pellucidum, normal variant. Cerebral volume is within normal limits for age. No midline shift, ventriculomegaly, mass effect, evidence of mass lesion, intracranial hemorrhage or evidence of cortically based acute infarction. Gray-white matter differentiation is within normal limits throughout the brain. No encephalomalacia identified. Vascular: Calcified atherosclerosis at the skull base. No suspicious intracranial vascular hyperdensity. Skull: Negative.  Bone mineralization is  within normal limits. Sinuses/Orbits: Tympanic cavities, visualized paranasal sinuses and mastoids are clear. Other: Visualized orbits and scalp soft tissues are within normal limits. IMPRESSION: Normal for age noncontrast Head CT. Electronically Signed   By: Genevie Ann M.D.   On: 07/18/2022 04:06      Subjective: Patient seen and examined at the bedside today.  During my evaluation, she was hemodynamically stable, overall comfortable.  Denies any significant dizziness or lightheadedness.  She thinks that she might be able to go home today.  She worked with PT and ambulated well. I called her son Mr Ridhi Almonte to discuss about discharge planning, call not received  Discharge Exam: Vitals:   07/19/22 0553 07/19/22 0830  BP: 120/70 (!) 149/82  Pulse: 72 72  Resp: 18   Temp: 98.1 F (36.7 C)   SpO2: 95%    Vitals:   07/18/22 1735 07/18/22 2147 07/19/22 0553 07/19/22 0830  BP: 138/70 133/81 120/70 (!) 149/82  Pulse: 74 76 72 72  Resp: 16 17 18    Temp: 98.4 F (36.9 C) 98.2 F (36.8 C) 98.1 F (36.7 C)   TempSrc: Oral Oral Oral   SpO2: 98% 94% 95%   Weight:      Height:        General: Pt is alert, awake, not in acute distress Cardiovascular: RRR, S1/S2 +, no rubs, no gallops Respiratory: CTA bilaterally, no wheezing, no rhonchi Abdominal: Soft, NT, ND, bowel sounds + Extremities: no edema, no cyanosis    The results of significant diagnostics from this hospitalization (including imaging, microbiology, ancillary and laboratory) are listed below for reference.     Microbiology: No results found for this or any previous visit (from the past 240 hour(s)).   Labs: BNP (last 3 results) No results for input(s): "BNP" in the last 8760 hours. Basic Metabolic Panel: Recent Labs  Lab 07/18/22 0235 07/18/22 0236 07/19/22 0557  NA 137  --  138  K 2.6*  --  3.9  CL 102  --  108  CO2 23  --  23  GLUCOSE 255*  --  158*  BUN 17  --  11  CREATININE 0.72  --  0.68  CALCIUM  8.9  --  8.5*  MG  --  2.0  --   PHOS  --  3.2  --    Liver Function Tests: Recent Labs  Lab 07/18/22 0235 07/19/22 0557  AST 25 19  ALT 32 27  ALKPHOS 90 79  BILITOT 0.7 0.7  PROT 7.5 6.9  ALBUMIN 4.0 3.6   Recent Labs  Lab 07/18/22 0235  LIPASE 33   No results for input(s): "AMMONIA" in the last 168 hours. CBC: Recent Labs  Lab 07/18/22 0235 07/19/22 0557  WBC 10.3 4.8  NEUTROABS 7.2  --   HGB 12.5 12.6  HCT 37.6 39.7  MCV 85.6 87.4  PLT 262 250   Cardiac Enzymes: No results for input(s): "CKTOTAL", "CKMB", "CKMBINDEX", "TROPONINI" in the last 168 hours. BNP: Invalid input(s): "POCBNP" CBG: Recent Labs  Lab 07/18/22 0332 07/18/22 1232 07/18/22 1640 07/18/22 2126 07/19/22 0726  GLUCAP 226* 122* 126* 139* 140*   D-Dimer No results for input(s): "DDIMER" in the last 72 hours. Hgb A1c Recent Labs    07/18/22 0235  HGBA1C 8.6*   Lipid Profile No results for input(s): "CHOL", "HDL", "LDLCALC", "TRIG", "CHOLHDL", "LDLDIRECT" in the last 72 hours. Thyroid function studies No results for input(s): "TSH", "T4TOTAL", "T3FREE", "THYROIDAB" in the last 72 hours.  Invalid input(s): "FREET3" Anemia work up No results for input(s): "VITAMINB12", "FOLATE", "FERRITIN", "TIBC", "IRON", "RETICCTPCT" in the last 72 hours. Urinalysis    Component Value Date/Time   COLORURINE COLORLESS (A) 07/18/2022 0326   APPEARANCEUR CLEAR 07/18/2022 0326   LABSPEC 1.016 07/18/2022 0326   PHURINE 7.0 07/18/2022 0326   GLUCOSEU >=500 (A) 07/18/2022 0326   HGBUR NEGATIVE 07/18/2022 0326   BILIRUBINUR NEGATIVE 07/18/2022 0326   KETONESUR 5 (A) 07/18/2022 0326   PROTEINUR NEGATIVE 07/18/2022 0326   NITRITE NEGATIVE 07/18/2022 0326   LEUKOCYTESUR NEGATIVE 07/18/2022 0326   Sepsis Labs Recent Labs  Lab 07/18/22 0235 07/19/22 0557  WBC 10.3 4.8   Microbiology No results found for this or any previous visit (from the past 240 hour(s)).  Please note: You were cared for  by a hospitalist during your hospital stay. Once you are discharged, your primary care physician will handle any further medical issues. Please note that NO REFILLS for any discharge medications will be authorized once you are discharged, as it is imperative that you return to your primary care physician (or establish a relationship with a primary care physician if you do not have one) for your post hospital discharge needs so that they can reassess your need for medications and monitor your lab values.    Time coordinating discharge: 40 minutes  SIGNED:   Shelly Coss, MD  [facility] 07/19/2022, 11:40 AM Pager [phone]  If 7PM-7AM, please contact night-coverage [email or web address] Password [email or web address]

## 2022-07-19 NOTE — Evaluation (Signed)
Physical Therapy Evaluation Patient Details Name: Carrie Woodward MRN: [id number] DOB: [birth year] Today's Date: 07/19/2022  History of Present Illness  67 y.o. female with medical history significant of seasonal allergies, osteoarthritis, type 2 diabetes, GERD, headaches, hypertension who presented to the emergency department with sudden onset of dizziness and emesis that worsened with movement or changing position.  Clinical Impression  Pt admitted with above diagnosis.  Pt currently with functional limitations due to the deficits listed below (see PT Problem List). Pt will benefit from acute skilled PT to increase their independence and safety with mobility to allow discharge.  Pt seen for vestibular evaluation (see below).  Pt only reports mild dizziness/faintness throughout session which remained the same.  No nystagmus observed with oculomotor exam and BPPV testing.  Also obtained orthostatic vitals (also below).  Pt reports symptoms much improved since admission.  Pt able to ambulate in hallway however required RW for improved safety and stability.   07/19/22 1042  Vestibular Assessment  General Observation Reports a constant light dizziness but spinning with sitting and standing  Symptom Behavior  Subjective history of current problem no history of vertigo.  Started Wednesday night when she got up to go to the bathroom from sleeping  Type of Dizziness  Spinning  Frequency of Dizziness any time she sits up or stands up  Duration of Dizziness a few minutes  Symptom Nature Positional  Aggravating Factors Supine to sit;Sit to stand  Relieving Factors Rest;Dark room;Closing eyes  Progression of Symptoms Better  History of similar episodes none  Oculomotor Exam  Oculomotor Alignment Normal  Spontaneous Absent  Gaze-induced  Absent  Head shaking Horizontal Absent  Head Shaking Vertical Absent  Smooth Pursuits Intact  Saccades Slow  Comment giddiness, no increase in dizziness/spinning  with testing  Positional Testing  Sidelying Test Sidelying Right;Sidelying Left  Horizontal Canal Testing Horizontal Canal Right;Horizontal Canal Left  Sidelying Right  Sidelying Right Symptoms No nystagmus  Sidelying Left  Sidelying Left Symptoms No nystagmus  Horizontal Canal Right  Horizontal Canal Right Symptoms Normal  Horizontal Canal Left  Horizontal Canal Left Symptoms Normal    07/19/22 1052  Vital Signs  Pulse Rate Source Dinamap  BP Location Right Arm  BP Method Automatic  Patient Position (if appropriate) Orthostatic Vitals  Orthostatic Lying   BP- Lying 128/70  Pulse- Lying 88  Orthostatic Sitting  BP- Sitting 135/78  Pulse- Sitting 88  Orthostatic Standing at 0 minutes  BP- Standing at 0 minutes 134/87  Pulse- Standing at 0 minutes 97  Orthostatic Standing at 3 minutes  BP- Standing at 3 minutes 142/90  Pulse- Standing at 3 minutes 103          Recommendations for follow up therapy are one component of a multi-disciplinary discharge planning process, led by the attending physician.  Recommendations may be updated based on patient status, additional functional criteria and insurance authorization.  Follow Up Recommendations No PT follow up      Assistance Recommended at Discharge PRN  Patient can return home with the following       Equipment Recommendations Rolling walker (2 wheels)  Recommendations for Other Services       Functional Status Assessment Patient has had a recent decline in their functional status and demonstrates the ability to make significant improvements in function in a reasonable and predictable amount of time.     Precautions / Restrictions Precautions Precautions: Fall      Mobility  Bed Mobility Overal bed mobility: Modified  Independent                  Transfers Overall transfer level: Modified independent                      Ambulation/Gait Ambulation/Gait assistance: Supervision, Min  guard Gait Distance (Feet): 120 Feet Assistive device: Rolling walker (2 wheels) Gait Pattern/deviations: Step-through pattern, Decreased stride length       General Gait Details: slow but steady with RW, encouraged pt to improve pace as tolerated  Stairs            Wheelchair Mobility    Modified Rankin (Stroke Patients Only)       Balance                                             Pertinent Vitals/Pain Pain Assessment Pain Assessment: No/denies pain    Home Living Family/patient expects to be discharged to:: Private residence Living Arrangements: Alone   Type of Home: Apartment Home Access: Level entry       Home Layout: One level Home Equipment: None      Prior Function Prior Level of Function : Independent/Modified Independent                     Hand Dominance        Extremity/Trunk Assessment        Lower Extremity Assessment Lower Extremity Assessment: Overall WFL for tasks assessed    Cervical / Trunk Assessment Cervical / Trunk Assessment: Normal  Communication   Communication: No difficulties  Cognition Arousal/Alertness: Awake/alert Behavior During Therapy: WFL for tasks assessed/performed Overall Cognitive Status: Within Functional Limits for tasks assessed                                          General Comments      Exercises     Assessment/Plan    PT Assessment Patient needs continued PT services  PT Problem List Decreased mobility;Decreased activity tolerance       PT Treatment Interventions Gait training;DME instruction;Functional mobility training;Therapeutic activities;Patient/family education;Therapeutic exercise;Balance training    PT Goals (Current goals can be found in the Care Plan section)  Acute Rehab PT Goals PT Goal Formulation: With patient Time For Goal Achievement: 07/26/22 Potential to Achieve Goals: Good    Frequency Min 3X/week      Co-evaluation               AM-PAC PT "6 Clicks" Mobility  Outcome Measure Help needed turning from your back to your side while in a flat bed without using bedrails?: None Help needed moving from lying on your back to sitting on the side of a flat bed without using bedrails?: None Help needed moving to and from a bed to a chair (including a wheelchair)?: None Help needed standing up from a chair using your arms (e.g., wheelchair or bedside chair)?: None Help needed to walk in hospital room?: A Little Help needed climbing 3-5 steps with a railing? : A Little 6 Click Score: 22    End of Session Equipment Utilized During Treatment: Gait belt Activity Tolerance: Patient tolerated treatment well Patient left: in bed;with call bell/phone within reach;with bed alarm set Nurse Communication: Mobility status  PT Visit Diagnosis: Difficulty in walking, not elsewhere classified (R26.2)    Time: [id number] PT Time Calculation (min) (ACUTE ONLY): 36 min   Charges:   PT Evaluation $PT Eval Moderate Complexity: 1 Mod PT Treatments $Gait Training: 8-22 mins      Jannette Spanner PT, DPT Physical Therapist Acute Rehabilitation Services Preferred contact method: Secure Chat Weekend Pager Only: [phone] Office: [place] 07/19/2022, 1:00 PM

## 2022-07-22 DIAGNOSIS — R42 Dizziness and giddiness: Secondary | ICD-10-CM | POA: Diagnosis not present

## 2022-07-22 DIAGNOSIS — E1169 Type 2 diabetes mellitus with other specified complication: Secondary | ICD-10-CM | POA: Diagnosis not present

## 2022-07-22 DIAGNOSIS — E876 Hypokalemia: Secondary | ICD-10-CM | POA: Diagnosis not present

## 2022-08-08 DIAGNOSIS — H8120 Vestibular neuronitis, unspecified ear: Secondary | ICD-10-CM | POA: Diagnosis not present

## 2022-09-17 DIAGNOSIS — E1169 Type 2 diabetes mellitus with other specified complication: Secondary | ICD-10-CM | POA: Diagnosis not present

## 2022-09-17 DIAGNOSIS — Z6825 Body mass index (BMI) 25.0-25.9, adult: Secondary | ICD-10-CM | POA: Diagnosis not present

## 2022-09-17 DIAGNOSIS — H8110 Benign paroxysmal vertigo, unspecified ear: Secondary | ICD-10-CM | POA: Diagnosis not present

## 2022-09-17 DIAGNOSIS — E7800 Pure hypercholesterolemia, unspecified: Secondary | ICD-10-CM | POA: Diagnosis not present

## 2022-09-17 DIAGNOSIS — I10 Essential (primary) hypertension: Secondary | ICD-10-CM | POA: Diagnosis not present

## 2022-09-17 DIAGNOSIS — E785 Hyperlipidemia, unspecified: Secondary | ICD-10-CM | POA: Diagnosis not present

## 2022-09-17 DIAGNOSIS — E1142 Type 2 diabetes mellitus with diabetic polyneuropathy: Secondary | ICD-10-CM | POA: Diagnosis not present

## 2022-11-13 DIAGNOSIS — Z1211 Encounter for screening for malignant neoplasm of colon: Secondary | ICD-10-CM | POA: Diagnosis not present

## 2022-11-21 DIAGNOSIS — S20469A Insect bite (nonvenomous) of unspecified back wall of thorax, initial encounter: Secondary | ICD-10-CM | POA: Diagnosis not present

## 2022-11-21 DIAGNOSIS — S81851A Open bite, right lower leg, initial encounter: Secondary | ICD-10-CM | POA: Diagnosis not present

## 2022-11-21 DIAGNOSIS — W57XXXA Bitten or stung by nonvenomous insect and other nonvenomous arthropods, initial encounter: Secondary | ICD-10-CM | POA: Diagnosis not present

## 2022-12-02 DIAGNOSIS — Z1211 Encounter for screening for malignant neoplasm of colon: Secondary | ICD-10-CM | POA: Diagnosis not present

## 2022-12-02 DIAGNOSIS — E119 Type 2 diabetes mellitus without complications: Secondary | ICD-10-CM | POA: Diagnosis not present

## 2022-12-18 DIAGNOSIS — K635 Polyp of colon: Secondary | ICD-10-CM | POA: Diagnosis not present

## 2022-12-25 DIAGNOSIS — E7800 Pure hypercholesterolemia, unspecified: Secondary | ICD-10-CM | POA: Diagnosis not present

## 2022-12-25 DIAGNOSIS — I10 Essential (primary) hypertension: Secondary | ICD-10-CM | POA: Diagnosis not present

## 2022-12-25 DIAGNOSIS — E1142 Type 2 diabetes mellitus with diabetic polyneuropathy: Secondary | ICD-10-CM | POA: Diagnosis not present

## 2023-01-08 DIAGNOSIS — K648 Other hemorrhoids: Secondary | ICD-10-CM | POA: Diagnosis not present

## 2023-01-08 DIAGNOSIS — Z1211 Encounter for screening for malignant neoplasm of colon: Secondary | ICD-10-CM | POA: Diagnosis not present

## 2023-01-08 DIAGNOSIS — K5790 Diverticulosis of intestine, part unspecified, without perforation or abscess without bleeding: Secondary | ICD-10-CM | POA: Diagnosis not present

## 2023-03-24 DIAGNOSIS — E1142 Type 2 diabetes mellitus with diabetic polyneuropathy: Secondary | ICD-10-CM | POA: Diagnosis not present

## 2023-03-24 DIAGNOSIS — Z0000 Encounter for general adult medical examination without abnormal findings: Secondary | ICD-10-CM | POA: Diagnosis not present

## 2023-03-24 DIAGNOSIS — L509 Urticaria, unspecified: Secondary | ICD-10-CM | POA: Diagnosis not present

## 2023-03-24 DIAGNOSIS — E1169 Type 2 diabetes mellitus with other specified complication: Secondary | ICD-10-CM | POA: Diagnosis not present

## 2023-03-24 DIAGNOSIS — E7800 Pure hypercholesterolemia, unspecified: Secondary | ICD-10-CM | POA: Diagnosis not present

## 2023-03-24 DIAGNOSIS — I10 Essential (primary) hypertension: Secondary | ICD-10-CM | POA: Diagnosis not present

## 2023-06-24 DIAGNOSIS — E1169 Type 2 diabetes mellitus with other specified complication: Secondary | ICD-10-CM | POA: Diagnosis not present

## 2023-06-24 DIAGNOSIS — E7800 Pure hypercholesterolemia, unspecified: Secondary | ICD-10-CM | POA: Diagnosis not present

## 2023-06-24 DIAGNOSIS — I10 Essential (primary) hypertension: Secondary | ICD-10-CM | POA: Diagnosis not present

## 2023-07-21 ENCOUNTER — Telehealth: Payer: Self-pay

## 2023-07-21 NOTE — Progress Notes (Signed)
 07/21/2023 Name: Carrie Woodward MRN: [id number] DOB: [birth year]  Chief Complaint  Patient presents with   Medication Adherence    Glyxambi    Carrie Woodward is a 68 y.o. year old female who presented for a telephone visit.   They were referred to the pharmacist by a quality report for assistance in managing  medication adherence .    Subjective:  Care Team: Primary Care Provider: Mirna Mires, MD    Medication Access/Adherence  Current Pharmacy:  [id number] [place], [place] - [phone] AT [facility] OF [facility] & [address] [place] [address] Phone: [phone] Fax: [phone]  [id number] - [place], [place] - [address] AT [facility] OF [address] [place] [address] Phone: [phone] Fax: [phone]   Patient reports affordability concerns with their medications: No  Patient reports access/transportation concerns to their pharmacy: No  Patient reports adherence concerns with their medications:  No  Patient reports still having medication at home.    Medication Management:  Current adherence strategy: Patient gets a 90 day supply of most of her maintenance medications. Hydrochlorothiazide, Telmisartan both filled in January for a 90 day supply. Atorvastatin filled in February for a 90 day supply.   *Glyxambi fills 03/18/24 - 30 day supply 05/06/23 - 30 day supply  Patient reports Fair adherence to medications  Patient reports the following barriers to adherence: Last year the patient had [facility] but has since switched to [facility], so Glyxambi has a zero copay.     Objective:  Lab Results  Component Value Date   HGBA1C 8.6 (H) 07/18/2022    Lab Results  Component Value Date   CREATININE 0.68 07/19/2022   BUN 11 07/19/2022   NA 138 07/19/2022   K 3.9 07/19/2022   CL 108 07/19/2022   CO2 23 07/19/2022    No results found for: "CHOL", "HDL",  "LDLCALC", "LDLDIRECT", "TRIG", "CHOLHDL"  Medications Reviewed Today     Reviewed by Harlon Flor, [facility] (Pharmacist) on 07/21/23 at 1700  Med List Status: <None>   Medication Order Taking? Sig Documenting Provider Last Dose Status Informant  ACCU-CHEK GUIDE TEST test strip [id number] Yes USE ONCE A DAY [provider] Taking Active   Accu-Chek Softclix Lancets lancets [id number] Yes Use once a day [provider] Taking Active   acetaminophen (TYLENOL) 500 MG tablet [id number] Yes Take 500 mg by mouth every 8 (eight) hours as needed for mild pain. [provider] Taking Active Self, Pharmacy Records  amoxicillin (AMOXIL) 500 MG capsule [id number] Yes Take 500 mg by mouth every 8 (eight) hours. [provider] Taking Active   aspirin 81 MG tablet [id number] Yes Take 81 mg by mouth daily with breakfast. [provider] Taking Active Self, Pharmacy Records  atorvastatin (LIPITOR) 20 MG tablet [id number] Yes Take 20 mg by mouth daily. [provider] Taking Active Self, Pharmacy Records  Blood Glucose Monitoring Suppl (ACCU-CHEK GUIDE ME) w/Device KIT [id number] Yes Use once a day [provider] Taking Active   cetirizine (ZYRTEC) 10 MG tablet [id number] Yes Take 10 mg by mouth daily. [provider] Taking Active Self  Chlorpheniramine-DM (CORICIDIN HBP COUGH/COLD PO) [id number] Yes Take 1 tablet by mouth daily as needed (cold/sinus symptoms). [provider] Taking Active Self, Pharmacy Records           Med Note Colvin Caroli, [place] D   Mon Jul 21, 2023  4:52 PM) Uses prn  Empagliflozin-linaGLIPtin (GLYXAMBI) 25-5 MG TABS [id number] Yes Take by mouth. [provider] Taking Active Self, Pharmacy Records           Med Note Simonne Martinet Jul 21, 2023  4:56 PM)    hydrochlorothiazide (HYDRODIURIL) 25 MG tablet [id number] Yes Take 12.5 mg by mouth daily. [provider] Taking Active Self, Pharmacy Records   meclizine (ANTIVERT) 25 MG tablet [id number] Yes Take 1 tablet (25 mg total) by mouth 3 (three) times daily as needed for dizziness. Burnadette Pop, MD Taking Active   telmisartan (MICARDIS) 40 MG tablet [id number] Yes Take 40 mg by mouth daily. [provider] Taking Active Self, Pharmacy Records              Assessment/Plan:   Medication Management: - Currently strategy insufficient to maintain appropriate adherence to prescribed medication regimen - Discussed list of medication, indication, and administration time.  - Reviewed patient's OTCs, educated about duplications of therapy, importance of reviewing active ingredients due to multi-branded OTC products - Patient is due for a refill of Glyxambi, left voicemail for nurse to renew rx for a 90 day supply with 3 refills if clinically appropriate.     Follow Up Plan: Will follow up with patient in 3 months to assess adherence and diabetes management.  Harlon Flor, PharmD Clinical Pharmacist  [phone]

## 2023-08-07 NOTE — Progress Notes (Signed)
   08/07/2023 Name: Carrie Woodward MRN: [id number] DOB: 06/12/1955  Chief Complaint  Patient presents with   Medication Adherence    Glyxambi   Glyxambi 25-5 mg prescription sent in for 90-day supply on 07/22/23, patient picked up on 07/25/23. Prescription sent in with 1 refill.    Follow Up Plan: Will follow up with patient in 3 months to assess adherence and diabetes management.  Harlon Flor, PharmD Clinical Pharmacist  [phone]

## 2023-09-30 DIAGNOSIS — E7800 Pure hypercholesterolemia, unspecified: Secondary | ICD-10-CM | POA: Diagnosis not present

## 2023-09-30 DIAGNOSIS — I10 Essential (primary) hypertension: Secondary | ICD-10-CM | POA: Diagnosis not present

## 2023-09-30 DIAGNOSIS — E1142 Type 2 diabetes mellitus with diabetic polyneuropathy: Secondary | ICD-10-CM | POA: Diagnosis not present

## 2023-10-24 ENCOUNTER — Telehealth: Payer: Self-pay

## 2023-10-24 NOTE — Progress Notes (Signed)
   10/24/2023  Patient ID: Carrie Woodward, female   DOB: 26-Jun-1955, 68 y.o.   MRN: [id number]  Contacted patient regarding diabetes from a quality report for [facility]. Patient was on [facility] roster for A1c > 8. I left a HIPAA compliant voicemail. I will follow up next week.   Heather Factor, PharmD Clinical Pharmacist  [phone]

## 2023-10-28 NOTE — Progress Notes (Signed)
   10/28/2023  Patient ID: Carrie Woodward, female   DOB: [birth year], 68 y.o.   MRN: [id number]  Contacted patient regarding diabetes from a quality report for [facility]. Patient was on [facility] DM roster for A1c > 8.Patient called back, declined pharmacy engagement for disease state management at this time.   The Population Health team is pleased to engage with this patient at any time in the future upon receipt of referral and should he/she be interested in assistance from the [facility] team.   Heather Factor, PharmD Clinical Pharmacist  [phone]

## 2023-12-23 DIAGNOSIS — H2513 Age-related nuclear cataract, bilateral: Secondary | ICD-10-CM | POA: Diagnosis not present

## 2023-12-23 DIAGNOSIS — E119 Type 2 diabetes mellitus without complications: Secondary | ICD-10-CM | POA: Diagnosis not present

## 2023-12-23 DIAGNOSIS — H04123 Dry eye syndrome of bilateral lacrimal glands: Secondary | ICD-10-CM | POA: Diagnosis not present

## 2023-12-30 DIAGNOSIS — E1169 Type 2 diabetes mellitus with other specified complication: Secondary | ICD-10-CM | POA: Diagnosis not present

## 2023-12-30 DIAGNOSIS — I10 Essential (primary) hypertension: Secondary | ICD-10-CM | POA: Diagnosis not present

## 2023-12-30 DIAGNOSIS — E7800 Pure hypercholesterolemia, unspecified: Secondary | ICD-10-CM | POA: Diagnosis not present

## 2024-01-19 DIAGNOSIS — I10 Essential (primary) hypertension: Secondary | ICD-10-CM | POA: Diagnosis not present

## 2024-01-19 DIAGNOSIS — E785 Hyperlipidemia, unspecified: Secondary | ICD-10-CM | POA: Diagnosis not present

## 2024-01-19 DIAGNOSIS — E169 Disorder of pancreatic internal secretion, unspecified: Secondary | ICD-10-CM | POA: Diagnosis not present

## 2024-04-05 DIAGNOSIS — I10 Essential (primary) hypertension: Secondary | ICD-10-CM | POA: Diagnosis not present

## 2024-04-05 DIAGNOSIS — E7800 Pure hypercholesterolemia, unspecified: Secondary | ICD-10-CM | POA: Diagnosis not present

## 2024-04-05 DIAGNOSIS — E1169 Type 2 diabetes mellitus with other specified complication: Secondary | ICD-10-CM | POA: Diagnosis not present

## 2024-04-05 DIAGNOSIS — E1142 Type 2 diabetes mellitus with diabetic polyneuropathy: Secondary | ICD-10-CM | POA: Diagnosis not present

## 2024-05-03 ENCOUNTER — Encounter: Payer: Self-pay | Admitting: *Deleted

## 2024-05-03 NOTE — Progress Notes (Signed)
 Carrie Woodward                                          MRN: [id number]   05/03/2024   The [facility] Quality Team Specialist reviewed this patient medical record for the purposes of chart review for care gap closure. The following were reviewed: chart review for care gap closure-controlling blood pressure.    [facility] Quality Team
